# Patient Record
Sex: Male | Born: 1950 | Race: Asian | Hispanic: No | Marital: Married | Smoking: Never smoker
Health system: Southern US, Community
[De-identification: ages and names within clinical notes are randomized; demographics above are authoritative.]

## PROBLEM LIST (undated history)

## (undated) DIAGNOSIS — M1712 Unilateral primary osteoarthritis, left knee: Secondary | ICD-10-CM

## (undated) DIAGNOSIS — E119 Type 2 diabetes mellitus without complications: Secondary | ICD-10-CM

## (undated) DIAGNOSIS — I10 Essential (primary) hypertension: Secondary | ICD-10-CM

## (undated) HISTORY — PX: CATARACT EXTRACTION W/ INTRAOCULAR LENS  IMPLANT, BILATERAL: SHX1307

## (undated) HISTORY — PX: LEG SURGERY: SHX1003

---

## 2004-03-06 ENCOUNTER — Ambulatory Visit: Payer: Self-pay | Admitting: *Deleted

## 2004-09-07 ENCOUNTER — Ambulatory Visit: Payer: Self-pay | Admitting: Internal Medicine

## 2004-11-02 ENCOUNTER — Ambulatory Visit: Payer: Self-pay | Admitting: Family Medicine

## 2005-03-01 ENCOUNTER — Ambulatory Visit: Payer: Self-pay | Admitting: Family Medicine

## 2005-08-09 ENCOUNTER — Ambulatory Visit: Payer: Self-pay | Admitting: Family Medicine

## 2005-11-12 ENCOUNTER — Ambulatory Visit: Payer: Self-pay | Admitting: Family Medicine

## 2005-11-13 ENCOUNTER — Ambulatory Visit: Payer: Self-pay | Admitting: Family Medicine

## 2005-11-14 ENCOUNTER — Ambulatory Visit (HOSPITAL_COMMUNITY): Admission: RE | Admit: 2005-11-14 | Discharge: 2005-11-14 | Payer: Self-pay | Admitting: Family Medicine

## 2005-12-20 ENCOUNTER — Ambulatory Visit: Payer: Self-pay | Admitting: Family Medicine

## 2006-03-10 ENCOUNTER — Ambulatory Visit: Payer: Self-pay | Admitting: Family Medicine

## 2006-07-24 ENCOUNTER — Ambulatory Visit: Payer: Self-pay | Admitting: Internal Medicine

## 2007-03-04 ENCOUNTER — Encounter (INDEPENDENT_AMBULATORY_CARE_PROVIDER_SITE_OTHER): Payer: Self-pay | Admitting: *Deleted

## 2007-04-24 ENCOUNTER — Ambulatory Visit: Payer: Self-pay | Admitting: Family Medicine

## 2007-04-30 ENCOUNTER — Ambulatory Visit (HOSPITAL_COMMUNITY): Admission: RE | Admit: 2007-04-30 | Discharge: 2007-04-30 | Payer: Self-pay | Admitting: Family Medicine

## 2007-05-06 ENCOUNTER — Ambulatory Visit: Payer: Self-pay | Admitting: Family Medicine

## 2007-05-27 ENCOUNTER — Ambulatory Visit: Payer: Self-pay | Admitting: Family Medicine

## 2007-05-27 LAB — CONVERTED CEMR LAB
BUN: 22 mg/dL (ref 6–23)
Chloride: 107 meq/L (ref 96–112)
Creatinine, Ser: 0.9 mg/dL (ref 0.40–1.50)
HDL: 48 mg/dL (ref >39)
Potassium: 4.2 meq/L (ref 3.5–5.3)
Sodium: 141 meq/L (ref 135–145)
Total Bilirubin: 0.7 mg/dL (ref 0.3–1.2)
Total CHOL/HDL Ratio: 3.1
VLDL: 17 mg/dL (ref 0–40)

## 2007-08-12 ENCOUNTER — Ambulatory Visit: Payer: Self-pay | Admitting: Family Medicine

## 2008-02-19 ENCOUNTER — Ambulatory Visit: Payer: Self-pay | Admitting: Family Medicine

## 2008-03-25 ENCOUNTER — Ambulatory Visit: Payer: Self-pay | Admitting: Family Medicine

## 2008-03-25 LAB — CONVERTED CEMR LAB
CO2: 21 meq/L (ref 19–32)
Calcium: 9 mg/dL (ref 8.4–10.5)
Cholesterol: 156 mg/dL (ref 0–200)
Creatinine, Ser: 0.99 mg/dL (ref 0.40–1.50)
Glucose, Bld: 101 mg/dL — ABNORMAL HIGH (ref 70–99)
PSA: 0.83 ng/mL (ref 0.10–4.00)
Potassium: 4.1 meq/L (ref 3.5–5.3)
Sodium: 138 meq/L (ref 135–145)

## 2008-03-30 IMAGING — CR DG KNEE 4+V BILAT
8 series · 8 of 8 positions shown · non-contrast
Comparison: none

CLINICAL DATA: Knee pain.
 LEFT KNEE ? 4 VIEW:

[view not recorded (1 of 8)]
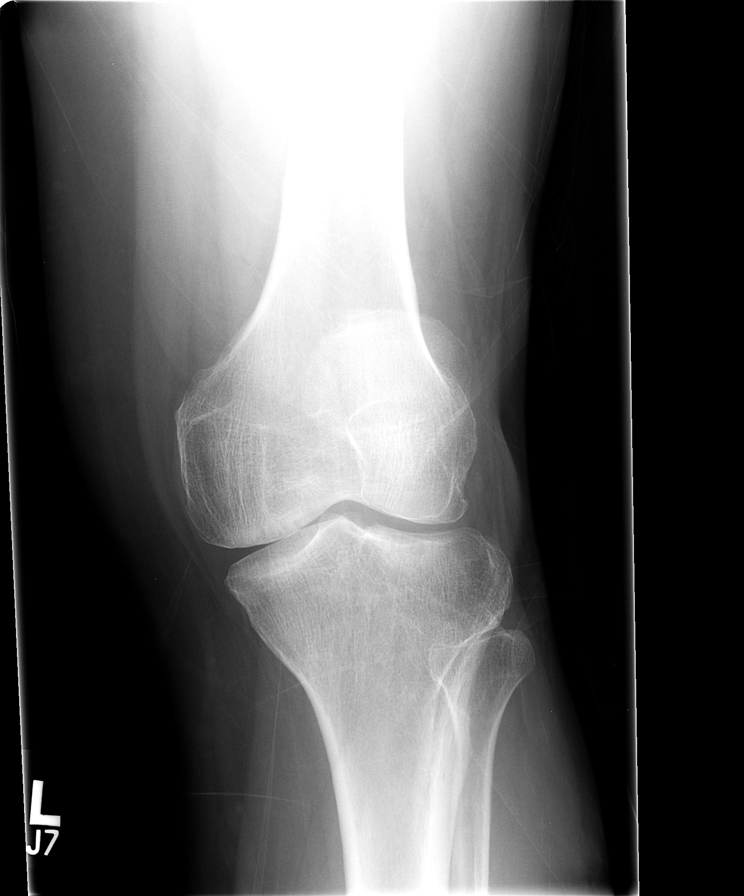

[view not recorded (2 of 8)]
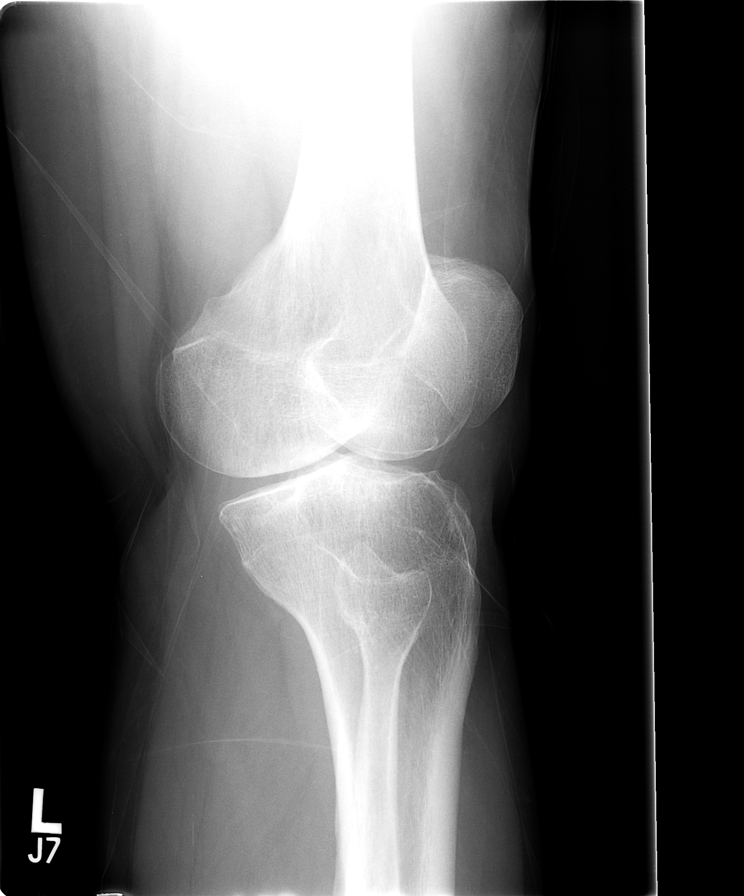

[view not recorded (3 of 8)]
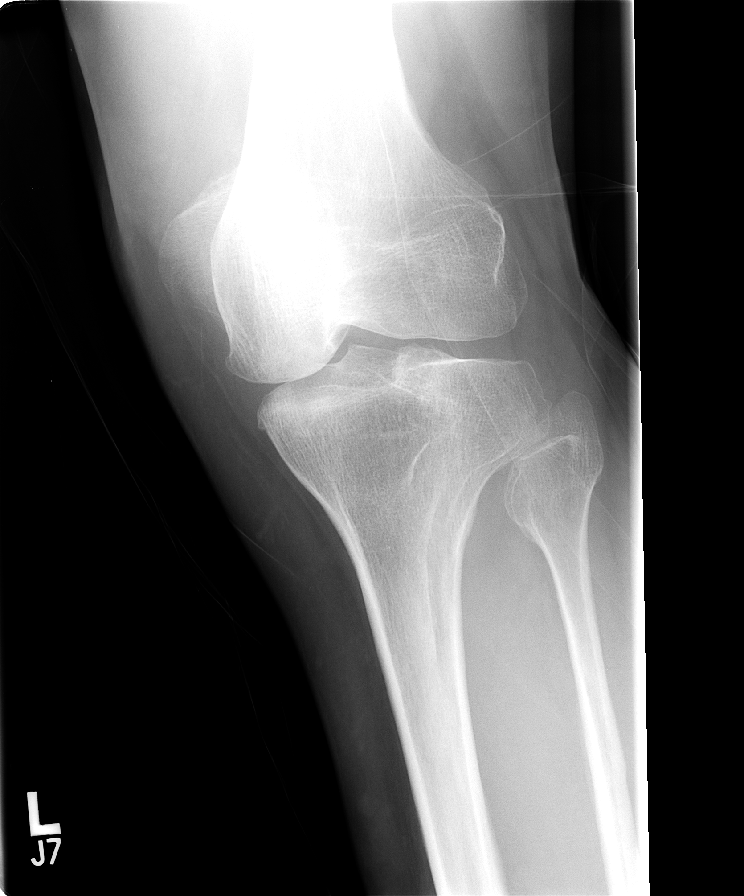

[view not recorded (4 of 8)]
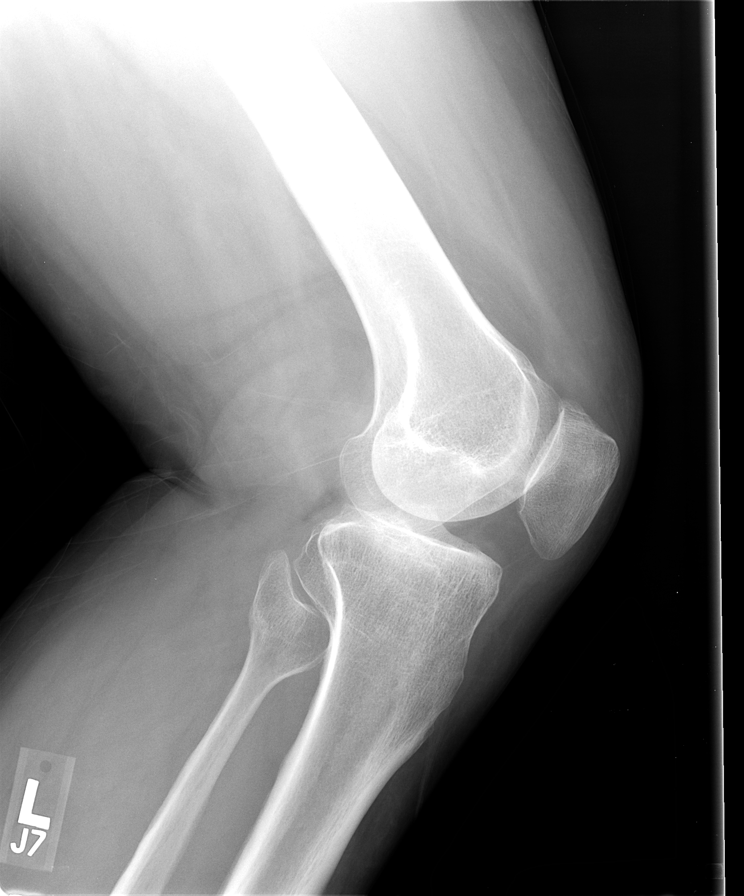

[view not recorded (5 of 8)]
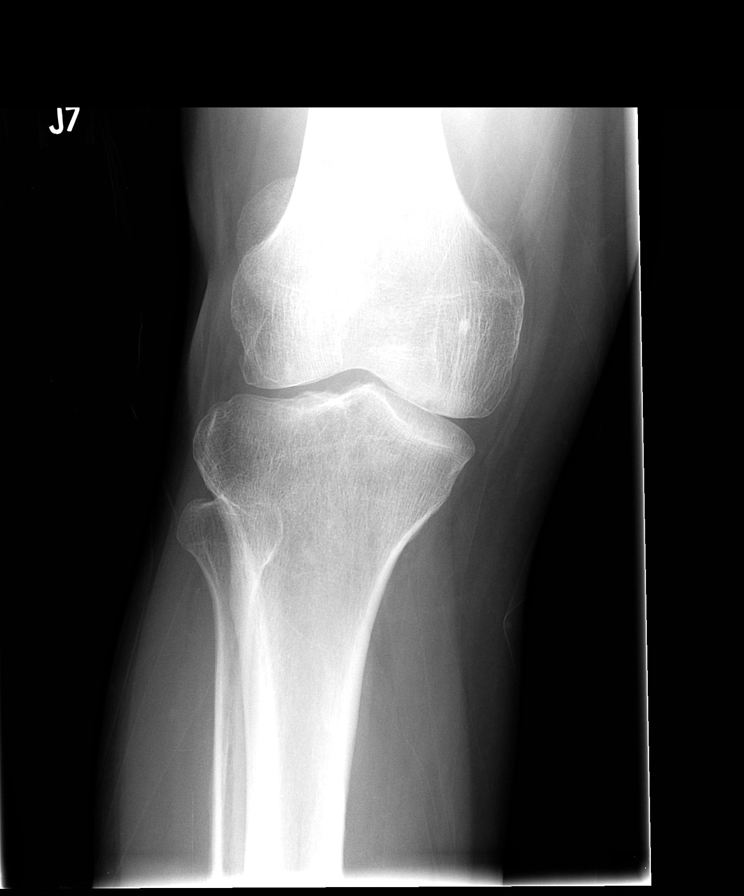

[view not recorded (6 of 8)]
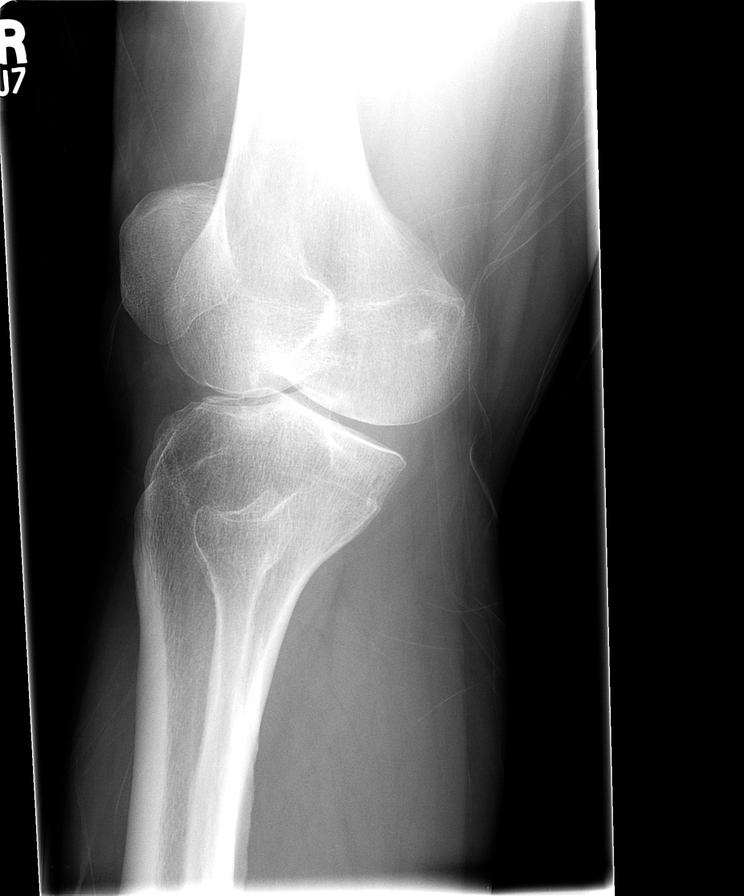

[view not recorded (7 of 8)]
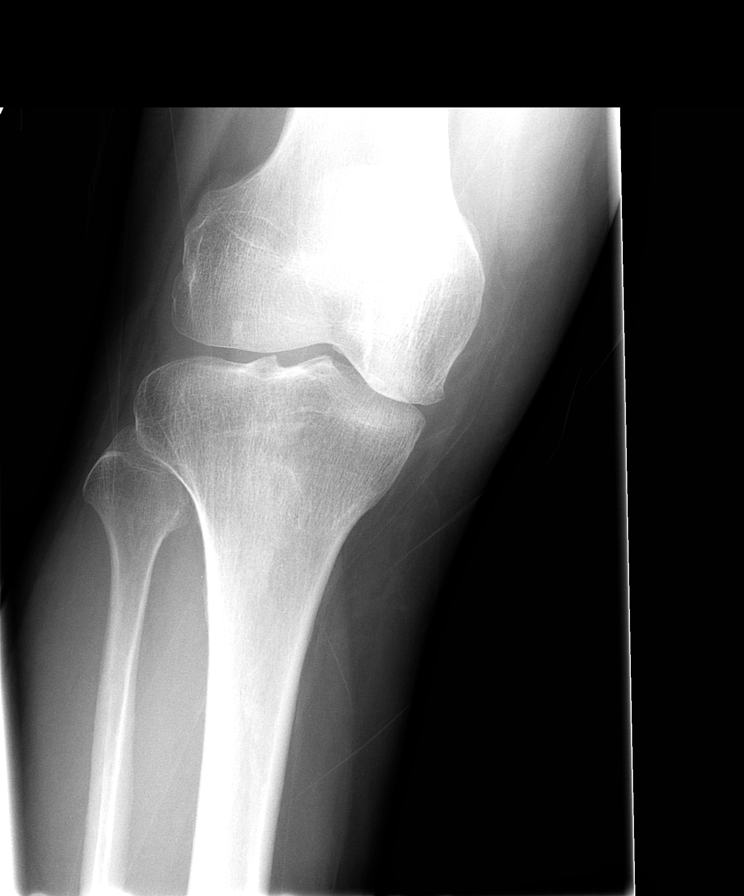

[view not recorded (8 of 8)]
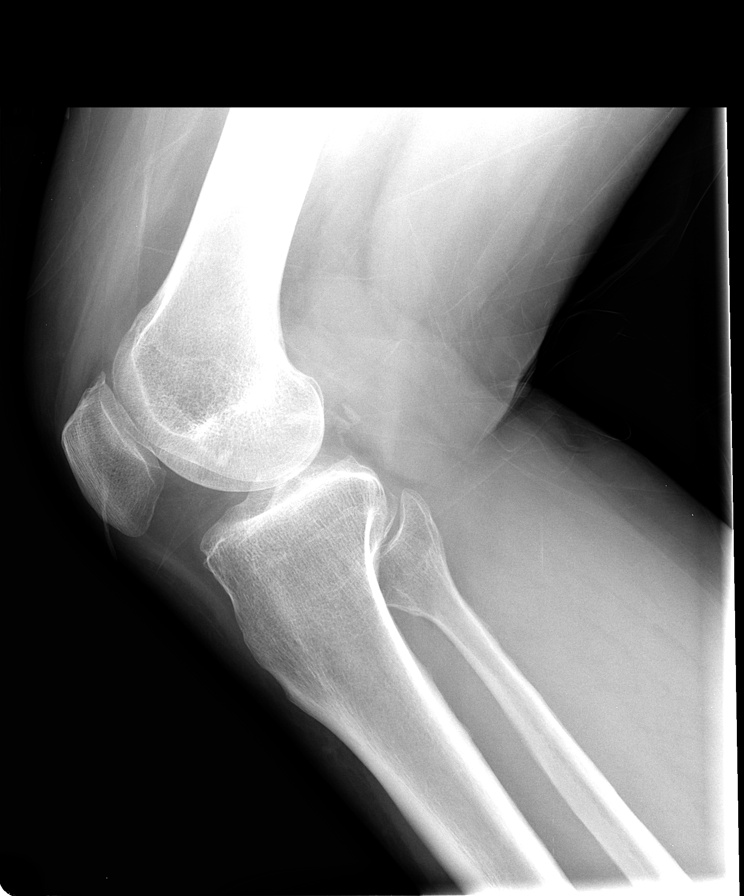

[8 of 8 positions shown; findings below may reference images not displayed]

FINDINGS: There is no evidence of fracture or dislocation.  There is no evidence of knee joint effusion.  Mild degenerative spurring is seen involving the medial compartment without significant joint space narrowing.  No other bone abnormality is identified.
IMPRESSION: 1.  No acute findings.
 2.  Mild medial compartment degenerative spurring.
 RIGHT KNEE ? 4 VIEW:
FINDINGS: There is no evidence of fracture or dislocation.  There is no definite evidence of knee joint effusion.  Mild degenerative spurring is seen involving both the medial and patellofemoral compartments without significant joint space narrowing.
IMPRESSION: 1.  No acute findings.
 2.  Mild medial and patellofemoral compartment degenerative spurring.

## 2009-02-12 ENCOUNTER — Emergency Department (HOSPITAL_COMMUNITY): Admission: EM | Admit: 2009-02-12 | Discharge: 2009-02-13 | Payer: Self-pay | Admitting: Emergency Medicine

## 2018-01-22 ENCOUNTER — Other Ambulatory Visit: Payer: Self-pay | Admitting: Orthopedic Surgery

## 2018-01-22 NOTE — Pre-Procedure Instructions (Signed)
Derek Lara  01/22/2018      [facility] - [place], [place] - [address] [place] [address] Phone: [phone] Fax: [phone]    Your procedure is scheduled on February 03, 2018.  Report to [facility] Admitting at 530 AM.  Call this number if you have problems the morning of surgery:  [phone]   Remember:  Do not eat or drink after midnight.    Take these medicines the morning of surgery with A SIP OF WATER  Gabapentin (neurontin) Metoprolol tartrate(lopressor)  7 days prior to surgery STOP taking any meloxicam (mobic), Aspirin (unless otherwise instructed by your surgeon), Aleve, Naproxen, Ibuprofen, Motrin, Advil, Goody's, BC's, all herbal medications, fish oil, and all vitamins  WHAT DO I DO ABOUT MY DIABETES MEDICATION?  Marland Kitchen Do not take oral diabetes medicines (pills) the morning of surgery metformin (glucophage). .  Reviewed and Endorsed by [facility] Patient Education Committee, August 2015  How to Manage Your Diabetes Before and After Surgery  Why is it important to control my blood sugar before and after surgery? . Improving blood sugar levels before and after surgery helps healing and can limit problems. . A way of improving blood sugar control is eating a healthy diet by: o  Eating less sugar and carbohydrates o  Increasing activity/exercise o  Talking with your doctor about reaching your blood sugar goals . High blood sugars (greater than 180 mg/dL) can raise your risk of infections and slow your recovery, so you will need to focus on controlling your diabetes during the weeks before surgery. . Make sure that the doctor who takes care of your diabetes knows about your planned surgery including the date and location.  How do I manage my blood sugar before surgery? . Check your blood sugar at least 4 times a day, starting 2 days before surgery, to make sure that the level is not too high or  low. o Check your blood sugar the morning of your surgery when you wake up and every 2 hours until you get to the Short Stay unit. . If your blood sugar is less than 70 mg/dL, you will need to treat for low blood sugar: o Do not take insulin. o Treat a low blood sugar (less than 70 mg/dL) with  cup of clear juice (cranberry or apple), 4 glucose tablets, OR glucose gel. Recheck blood sugar in 15 minutes after treatment (to make sure it is greater than 70 mg/dL). If your blood sugar is not greater than 70 mg/dL on recheck, call [phone] o  for further instructions. . Report your blood sugar to the short stay nurse when you get to Short Stay.  . If you are admitted to the hospital after surgery: o Your blood sugar will be checked by the staff and you will probably be given insulin after surgery (instead of oral diabetes medicines) to make sure you have good blood sugar levels. o The goal for blood sugar control after surgery is 80-180 mg/dL.   Do not wear jewelry, make-up or nail polish.  Do not wear lotions, powders, or perfumes, or deodorant.  Do not shave 48 hours prior to surgery.  Men may shave face and neck.  Do not bring valuables to the hospital.  [facility] is not responsible for any belongings or valuables.  Contacts, dentures or bridgework may not be worn into surgery.  Leave your suitcase in the car.  After surgery it  may be brought to your room.  For patients admitted to the hospital, discharge time will be determined by your treatment team.  Patients discharged the day of surgery will not be allowed to drive home.   [facility]- Preparing For Surgery  Before surgery, you can play an important role. Because skin is not sterile, your skin needs to be as free of germs as possible. You can reduce the number of germs on your skin by washing with CHG (chlorahexidine gluconate) Soap before surgery.  CHG is an antiseptic cleaner which kills germs and bonds with the skin to  continue killing germs even after washing.    Oral Hygiene is also important to reduce your risk of infection.  Remember - BRUSH YOUR TEETH THE MORNING OF SURGERY WITH YOUR REGULAR TOOTHPASTE  Please do not use if you have an allergy to CHG or antibacterial soaps. If your skin becomes reddened/irritated stop using the CHG.  Do not shave (including legs and underarms) for at least 48 hours prior to first CHG shower. It is OK to shave your face.  Please follow these instructions carefully.   1. Shower the NIGHT BEFORE SURGERY and the MORNING OF SURGERY with CHG.   2. If you chose to wash your hair, wash your hair first as usual with your normal shampoo.  3. After you shampoo, rinse your hair and body thoroughly to remove the shampoo.  4. Use CHG as you would any other liquid soap. You can apply CHG directly to the skin and wash gently with a scrungie or a clean washcloth.   5. Apply the CHG Soap to your body ONLY FROM THE NECK DOWN.  Do not use on open wounds or open sores. Avoid contact with your eyes, ears, mouth and genitals (private parts). Wash Face and genitals (private parts)  with your normal soap.  6. Wash thoroughly, paying special attention to the area where your surgery will be performed.  7. Thoroughly rinse your body with warm water from the neck down.  8. DO NOT shower/wash with your normal soap after using and rinsing off the CHG Soap.  9. Pat yourself dry with a CLEAN TOWEL.  10. Wear CLEAN PAJAMAS to bed the night before surgery, wear comfortable clothes the morning of surgery  11. Place CLEAN SHEETS on your bed the night of your first shower and DO NOT SLEEP WITH PETS.  Day of Surgery:  Do not apply any deodorants/lotions.  Please wear clean clothes to the hospital/surgery center.   Remember to brush your teeth WITH YOUR REGULAR TOOTHPASTE.  Please read over the following fact sheets that you were given. Pain Booklet, Coughing and Deep Breathing, MRSA  Information and Surgical Site Infection Prevention

## 2018-01-23 ENCOUNTER — Other Ambulatory Visit: Payer: Self-pay

## 2018-01-23 ENCOUNTER — Encounter (HOSPITAL_COMMUNITY): Payer: Self-pay

## 2018-01-23 ENCOUNTER — Encounter (HOSPITAL_COMMUNITY)
Admission: RE | Admit: 2018-01-23 | Discharge: 2018-01-23 | Disposition: A | Discharge status: Home / Self Care | Payer: Medicare Other | Source: Ambulatory Visit | Attending: Orthopedic Surgery | Admitting: Orthopedic Surgery

## 2018-01-23 DIAGNOSIS — Z79899 Other long term (current) drug therapy: Secondary | ICD-10-CM | POA: Diagnosis not present

## 2018-01-23 DIAGNOSIS — Z7984 Long term (current) use of oral hypoglycemic drugs: Secondary | ICD-10-CM | POA: Diagnosis not present

## 2018-01-23 DIAGNOSIS — I10 Essential (primary) hypertension: Secondary | ICD-10-CM | POA: Insufficient documentation

## 2018-01-23 DIAGNOSIS — M1712 Unilateral primary osteoarthritis, left knee: Secondary | ICD-10-CM | POA: Diagnosis not present

## 2018-01-23 DIAGNOSIS — E119 Type 2 diabetes mellitus without complications: Secondary | ICD-10-CM | POA: Insufficient documentation

## 2018-01-23 DIAGNOSIS — Z01812 Encounter for preprocedural laboratory examination: Secondary | ICD-10-CM | POA: Diagnosis present

## 2018-01-23 DIAGNOSIS — Z01810 Encounter for preprocedural cardiovascular examination: Secondary | ICD-10-CM | POA: Insufficient documentation

## 2018-01-23 HISTORY — DX: Essential (primary) hypertension: I10

## 2018-01-23 HISTORY — DX: Type 2 diabetes mellitus without complications: E11.9

## 2018-01-23 LAB — BASIC METABOLIC PANEL
Anion gap: 7 (ref 5–15)
BUN: 23 mg/dL (ref 8–23)
CALCIUM: 9.5 mg/dL (ref 8.9–10.3)
CO2: 26 mmol/L (ref 22–32)
CREATININE: 1.73 mg/dL — AB (ref 0.61–1.24)
Chloride: 109 mmol/L (ref 98–111)
GFR calc Af Amer: 46 mL/min — ABNORMAL LOW (ref >60)
GFR, EST NON AFRICAN AMERICAN: 39 mL/min — AB (ref >60)
Glucose, Bld: 117 mg/dL — ABNORMAL HIGH (ref 70–99)
Potassium: 5.1 mmol/L (ref 3.5–5.1)
SODIUM: 142 mmol/L (ref 135–145)

## 2018-01-23 LAB — CBC
HCT: 41.5 % (ref 39.0–52.0)
Hemoglobin: 12.8 g/dL — ABNORMAL LOW (ref 13.0–17.0)
MCH: 30.5 pg (ref 26.0–34.0)
MCHC: 30.8 g/dL (ref 30.0–36.0)
MCV: 99 fL (ref 78.0–100.0)
PLATELETS: 287 10*3/uL (ref 150–400)
RBC: 4.19 MIL/uL — ABNORMAL LOW (ref 4.22–5.81)
RDW: 13.6 % (ref 11.5–15.5)
WBC: 9 10*3/uL (ref 4.0–10.5)

## 2018-01-23 LAB — GLUCOSE, CAPILLARY: Glucose-Capillary: 100 mg/dL — ABNORMAL HIGH (ref 70–99)

## 2018-01-23 LAB — SURGICAL PCR SCREEN
MRSA, PCR: NEGATIVE
Staphylococcus aureus: NEGATIVE

## 2018-01-23 LAB — HEMOGLOBIN A1C
Hgb A1c MFr Bld: 6.2 % — ABNORMAL HIGH (ref 4.8–5.6)
Mean Plasma Glucose: 131.24 mg/dL

## 2018-01-23 NOTE — Progress Notes (Signed)
Requesting stress test , last ov from Dr. Algie CofferKadakia.  Faxed stop bang form to patient's PCP - [place].

## 2018-01-23 NOTE — Progress Notes (Signed)
   01/23/18 1116  OBSTRUCTIVE SLEEP APNEA  Have you ever been diagnosed with sleep apnea through a sleep study? No  Do you snore loudly (loud enough to be heard through closed doors)?  0  Do you often feel tired, fatigued, or sleepy during the daytime (such as falling asleep during driving or talking to someone)? 0  Has anyone observed you stop breathing during your sleep? 0  Do you have, or are you being treated for high blood pressure? 1  BMI more than 35 kg/m2? 1  Age > 50 (1-yes) 1  Neck circumference greater than:Male 16 inches or larger, Male 17inches or larger? 1  Male Gender (Yes=1) 1  Obstructive Sleep Apnea Score 5  Score 5 or greater  Results sent to PCP

## 2018-01-30 NOTE — Progress Notes (Signed)
Anesthesia Chart Review:  Case:  [id number] Date/Time:  02/03/18 0715   Procedure:  LEFT UNICOMPARTMENTAL KNEE (Left Knee)   Anesthesia type:  Choice   Pre-op diagnosis:  OA LEFT KNEE   Location:  [facility] OR ROOM 04 / [facility] OR   Surgeon:  Teryl LucyLandau, Joshua, MD      DISCUSSION: 67 yo male never smoker for above procedure. Pertinent hx includes DMII and HTN.  On PAT labs pt noted to have elevated creatinine at 1.73. No previous labs for comparison. I called his PCP's office to clarify whether he has hx of CKD and if he is being monitored for this. Dr. Ardelle ParkHaque brought pt in for repeat BMP on 01/28/2018 which showed creatinine 1.57. Per Dr. Ardelle ParkHaque this is pt's baseline and he is following the pt for this, stated ok to proceed with surgery.  Anticipate he can proceed with surgery as planned barring acute status change.  VS: BP (!) 156/74   Pulse (!) 56   Temp 36.7 C   Resp 20   Ht 5\' 6"  (1.676 m)   Wt 110.9 kg   SpO2 96%   BMI 39.45 kg/m   PROVIDERS: Verlene MayerHaque, [place], MD is PCP   LABS: Labs reviewed: Acceptable for surgery. Elevated Creatinine near pt baseline per Dr. Ardelle ParkHaque. (all labs ordered are listed, but only abnormal results are displayed)  Labs Reviewed  GLUCOSE, CAPILLARY - Abnormal; Notable for the following components:      Result Value   Glucose-Capillary 100 (*)    All other components within normal limits  CBC - Abnormal; Notable for the following components:   RBC 4.19 (*)    Hemoglobin 12.8 (*)    All other components within normal limits  BASIC METABOLIC PANEL - Abnormal; Notable for the following components:   Glucose, Bld 117 (*)    Creatinine, Ser 1.73 (*)    GFR calc non Af Amer 39 (*)    GFR calc Af Amer 46 (*)    All other components within normal limits  HEMOGLOBIN A1C - Abnormal; Notable for the following components:   Hgb A1c MFr Bld 6.2 (*)    All other components within normal limits  SURGICAL PCR SCREEN     IMAGES: N/A  EKG: 01/23/2018: Normal sinus rhythm.  Right bundle branch block  CV: N/A  Past Medical History:  Diagnosis Date  . Diabetes mellitus without complication (HCC)   . Hypertension     Past Surgical History:  Procedure Laterality Date  . CATARACT EXTRACTION W/ INTRAOCULAR LENS  IMPLANT, BILATERAL    . LEG SURGERY     vascular    MEDICATIONS: . atorvastatin (LIPITOR) 20 MG tablet  . gabapentin (NEURONTIN) 300 MG capsule  . lisinopril-hydrochlorothiazide (PRINZIDE,ZESTORETIC) 20-25 MG tablet  . meloxicam (MOBIC) 15 MG tablet  . metFORMIN (GLUCOPHAGE) 500 MG tablet  . metoprolol tartrate (LOPRESSOR) 50 MG tablet   No current facility-administered medications for this encounter.      Zannie CoveJames Burns, PA-C [facility] Phone [phone] 01/30/2018 9:59 AM

## 2018-02-02 NOTE — Anesthesia Preprocedure Evaluation (Addendum)
Anesthesia Evaluation  Patient identified by MRN, date of birth, ID band Patient awake    Reviewed: Allergy & Precautions, H&P , NPO status , Patient's Chart, lab work & pertinent test results, reviewed documented beta blocker date and time   Airway Mallampati: I       Dental no notable dental hx. (+) Teeth Intact   Pulmonary neg pulmonary ROS,    Pulmonary exam normal breath sounds clear to auscultation       Cardiovascular Exercise Tolerance: Good hypertension, Pt. on medications and Pt. on home beta blockers negative cardio ROS Normal cardiovascular exam Rhythm:Regular Rate:Normal     Neuro/Psych negative neurological ROS  negative psych ROS   GI/Hepatic negative GI ROS, Neg liver ROS,   Endo/Other  negative endocrine ROSdiabetes, Type 2, Oral Hypoglycemic AgentsMorbid obesity  Renal/GU negative Renal ROS  negative genitourinary   Musculoskeletal   Abdominal (+) + obese,   Peds  Hematology negative hematology ROS (+)   Anesthesia Other Findings   Reproductive/Obstetrics negative OB ROS                            Anesthesia Physical Anesthesia Plan  ASA: III  Anesthesia Plan: Spinal   Post-op Pain Management:  Regional for Post-op pain   Induction:   PONV Risk Score and Plan: 1 and Ondansetron, Propofol infusion and Midazolam  Airway Management Planned: Simple Face Mask, Natural Airway and Nasal Cannula  Additional Equipment:   Intra-op Plan:   Post-operative Plan:   Informed Consent: I have reviewed the patients History and Physical, chart, labs and discussed the procedure including the risks, benefits and alternatives for the proposed anesthesia with the patient or authorized representative who has indicated his/her understanding and acceptance.     Plan Discussed with: CRNA and Surgeon  Anesthesia Plan Comments:        Anesthesia Quick Evaluation

## 2018-02-03 ENCOUNTER — Ambulatory Visit (HOSPITAL_COMMUNITY)
Admission: RE | Admit: 2018-02-03 | Discharge: 2018-02-03 | Disposition: A | Discharge status: Home / Self Care | Payer: Medicare Other | Source: Ambulatory Visit | Attending: Orthopedic Surgery | Admitting: Orthopedic Surgery

## 2018-02-10 ENCOUNTER — Ambulatory Visit (HOSPITAL_COMMUNITY): Payer: Medicare Other | Admitting: Physician Assistant

## 2018-02-10 ENCOUNTER — Other Ambulatory Visit: Payer: Self-pay

## 2018-02-10 ENCOUNTER — Encounter (HOSPITAL_COMMUNITY): Payer: Self-pay | Admitting: Anesthesiology

## 2018-02-10 ENCOUNTER — Observation Stay (HOSPITAL_COMMUNITY): Payer: Medicare Other

## 2018-02-10 ENCOUNTER — Encounter (HOSPITAL_COMMUNITY)
Admission: RE | Disposition: A | Discharge status: Home / Self Care | Payer: Self-pay | Source: Ambulatory Visit | Attending: Orthopedic Surgery

## 2018-02-10 ENCOUNTER — Observation Stay (HOSPITAL_COMMUNITY)
Admission: RE | Admit: 2018-02-10 | Discharge: 2018-02-11 | Disposition: A | Discharge status: Home / Self Care | Payer: Medicare Other | Source: Ambulatory Visit | Attending: Orthopedic Surgery | Admitting: Orthopedic Surgery

## 2018-02-10 DIAGNOSIS — Z79899 Other long term (current) drug therapy: Secondary | ICD-10-CM | POA: Insufficient documentation

## 2018-02-10 DIAGNOSIS — Z7984 Long term (current) use of oral hypoglycemic drugs: Secondary | ICD-10-CM | POA: Diagnosis not present

## 2018-02-10 DIAGNOSIS — I10 Essential (primary) hypertension: Secondary | ICD-10-CM | POA: Diagnosis not present

## 2018-02-10 DIAGNOSIS — Z6839 Body mass index (BMI) 39.0-39.9, adult: Secondary | ICD-10-CM | POA: Diagnosis not present

## 2018-02-10 DIAGNOSIS — E119 Type 2 diabetes mellitus without complications: Secondary | ICD-10-CM | POA: Diagnosis not present

## 2018-02-10 DIAGNOSIS — M1712 Unilateral primary osteoarthritis, left knee: Secondary | ICD-10-CM | POA: Diagnosis present

## 2018-02-10 DIAGNOSIS — Z7982 Long term (current) use of aspirin: Secondary | ICD-10-CM | POA: Insufficient documentation

## 2018-02-10 DIAGNOSIS — Z96652 Presence of left artificial knee joint: Secondary | ICD-10-CM

## 2018-02-10 HISTORY — PX: PARTIAL KNEE ARTHROPLASTY: SHX2174

## 2018-02-10 HISTORY — DX: Unilateral primary osteoarthritis, left knee: M17.12

## 2018-02-10 LAB — GLUCOSE, CAPILLARY
GLUCOSE-CAPILLARY: 122 mg/dL — AB (ref 70–99)
GLUCOSE-CAPILLARY: 141 mg/dL — AB (ref 70–99)
Glucose-Capillary: 122 mg/dL — ABNORMAL HIGH (ref 70–99)
Glucose-Capillary: 125 mg/dL — ABNORMAL HIGH (ref 70–99)

## 2018-02-10 SURGERY — ARTHROPLASTY, KNEE, UNICOMPARTMENTAL
Anesthesia: Spinal | Site: Knee | Laterality: Left

## 2018-02-10 MED ORDER — CHLORHEXIDINE GLUCONATE 4 % EX LIQD: 60.0000 mL | Freq: Once | CUTANEOUS | Status: DC

## 2018-02-10 MED ORDER — METHOCARBAMOL 500 MG PO TABS
500.0000 mg | ORAL_TABLET | Freq: Four times a day (QID) | ORAL | Status: DC | PRN
  Administered 2018-02-10 – 2018-02-11 (×2): 500 mg via ORAL
  Filled 2018-02-10: qty 1

## 2018-02-10 MED ORDER — HYDROMORPHONE HCL 1 MG/ML IJ SOLN: 0.2500 mg | INTRAMUSCULAR | Status: DC | PRN

## 2018-02-10 MED ORDER — CEFAZOLIN SODIUM-DEXTROSE 2-4 GM/100ML-% IV SOLN
2.0000 g | INTRAVENOUS | Status: AC
  Administered 2018-02-10: 2 g via INTRAVENOUS

## 2018-02-10 MED ORDER — ONDANSETRON HCL 4 MG/2ML IJ SOLN
INTRAMUSCULAR | Status: AC
  Filled 2018-02-10: qty 2

## 2018-02-10 MED ORDER — METOPROLOL TARTRATE 50 MG PO TABS
50.0000 mg | ORAL_TABLET | Freq: Two times a day (BID) | ORAL | Status: DC
  Administered 2018-02-10 – 2018-02-11 (×2): 50 mg via ORAL
  Filled 2018-02-10 (×2): qty 1

## 2018-02-10 MED ORDER — POLYETHYLENE GLYCOL 3350 17 G PO PACK: 17.0000 g | PACK | Freq: Every day | ORAL | Status: DC | PRN

## 2018-02-10 MED ORDER — METFORMIN HCL 500 MG PO TABS
500.0000 mg | ORAL_TABLET | Freq: Two times a day (BID) | ORAL | Status: DC
  Administered 2018-02-10 – 2018-02-11 (×2): 500 mg via ORAL
  Filled 2018-02-10 (×2): qty 1

## 2018-02-10 MED ORDER — PROPOFOL 500 MG/50ML IV EMUL
INTRAVENOUS | Status: DC | PRN
  Administered 2018-02-10: 50 ug/kg/min via INTRAVENOUS

## 2018-02-10 MED ORDER — HYDROCODONE-ACETAMINOPHEN 5-325 MG PO TABS
ORAL_TABLET | ORAL | Status: AC
  Administered 2018-02-10: 14:00:00
  Filled 2018-02-10: qty 1

## 2018-02-10 MED ORDER — METOCLOPRAMIDE HCL 5 MG/ML IJ SOLN: 5.0000 mg | Freq: Three times a day (TID) | INTRAMUSCULAR | Status: DC | PRN

## 2018-02-10 MED ORDER — ACETAMINOPHEN 325 MG PO TABS: 325.0000 mg | ORAL_TABLET | Freq: Four times a day (QID) | ORAL | Status: DC | PRN

## 2018-02-10 MED ORDER — ALUM & MAG HYDROXIDE-SIMETH 200-200-20 MG/5ML PO SUSP: 30.0000 mL | ORAL | Status: DC | PRN

## 2018-02-10 MED ORDER — ONDANSETRON HCL 4 MG PO TABS: 4.0000 mg | ORAL_TABLET | Freq: Three times a day (TID) | ORAL | 0 refills | Status: DC | PRN

## 2018-02-10 MED ORDER — BUPIVACAINE HCL (PF) 0.25 % IJ SOLN
INTRAMUSCULAR | Status: AC
  Filled 2018-02-10: qty 30

## 2018-02-10 MED ORDER — ONDANSETRON HCL 4 MG/2ML IJ SOLN
INTRAMUSCULAR | Status: DC | PRN
  Administered 2018-02-10: 4 mg via INTRAVENOUS

## 2018-02-10 MED ORDER — METHOCARBAMOL 500 MG PO TABS
ORAL_TABLET | ORAL | Status: AC
  Administered 2018-02-10: 14:00:00
  Filled 2018-02-10: qty 1

## 2018-02-10 MED ORDER — ROPIVACAINE HCL 7.5 MG/ML IJ SOLN
INTRAMUSCULAR | Status: DC | PRN
  Administered 2018-02-10 (×6): 5 mL via PERINEURAL

## 2018-02-10 MED ORDER — BACLOFEN 10 MG PO TABS: 10.0000 mg | ORAL_TABLET | Freq: Three times a day (TID) | ORAL | 0 refills | Status: DC

## 2018-02-10 MED ORDER — SENNA-DOCUSATE SODIUM 8.6-50 MG PO TABS: 2.0000 | ORAL_TABLET | Freq: Every day | ORAL | 1 refills | Status: DC

## 2018-02-10 MED ORDER — DEXAMETHASONE SODIUM PHOSPHATE 10 MG/ML IJ SOLN
10.0000 mg | Freq: Once | INTRAMUSCULAR | Status: AC
  Administered 2018-02-11: 10 mg via INTRAVENOUS
  Filled 2018-02-10: qty 1

## 2018-02-10 MED ORDER — PROMETHAZINE HCL 25 MG/ML IJ SOLN: 6.2500 mg | INTRAMUSCULAR | Status: DC | PRN

## 2018-02-10 MED ORDER — FENTANYL CITRATE (PF) 100 MCG/2ML IJ SOLN
INTRAMUSCULAR | Status: AC
  Administered 2018-02-10: 100 ug via INTRAVENOUS
  Filled 2018-02-10: qty 2

## 2018-02-10 MED ORDER — ASPIRIN EC 325 MG PO TBEC
325.0000 mg | DELAYED_RELEASE_TABLET | Freq: Two times a day (BID) | ORAL | Status: DC
  Administered 2018-02-10 – 2018-02-11 (×2): 325 mg via ORAL
  Filled 2018-02-10 (×2): qty 1

## 2018-02-10 MED ORDER — PROPOFOL 10 MG/ML IV BOLUS
INTRAVENOUS | Status: DC | PRN
  Administered 2018-02-10 (×2): 20 mg via INTRAVENOUS

## 2018-02-10 MED ORDER — ZOLPIDEM TARTRATE 5 MG PO TABS: 5.0000 mg | ORAL_TABLET | Freq: Every evening | ORAL | Status: DC | PRN

## 2018-02-10 MED ORDER — BUPIVACAINE IN DEXTROSE 0.75-8.25 % IT SOLN
INTRATHECAL | Status: DC | PRN
  Administered 2018-02-10: 1.8 mL via INTRATHECAL

## 2018-02-10 MED ORDER — 0.9 % SODIUM CHLORIDE (POUR BTL) OPTIME
TOPICAL | Status: DC | PRN
  Administered 2018-02-10: 1000 mL

## 2018-02-10 MED ORDER — METHOCARBAMOL 1000 MG/10ML IJ SOLN
500.0000 mg | Freq: Four times a day (QID) | INTRAVENOUS | Status: DC | PRN
  Filled 2018-02-10: qty 5

## 2018-02-10 MED ORDER — SODIUM CHLORIDE 0.9 % IV SOLN
INTRAVENOUS | Status: DC | PRN
  Administered 2018-02-10: 25 ug/min via INTRAVENOUS

## 2018-02-10 MED ORDER — KETOROLAC TROMETHAMINE 30 MG/ML IJ SOLN: 30.0000 mg | Freq: Once | INTRAMUSCULAR | Status: DC | PRN

## 2018-02-10 MED ORDER — LISINOPRIL-HYDROCHLOROTHIAZIDE 20-25 MG PO TABS: 1.0000 | ORAL_TABLET | Freq: Every day | ORAL | Status: DC

## 2018-02-10 MED ORDER — DIPHENHYDRAMINE HCL 12.5 MG/5ML PO ELIX: 12.5000 mg | ORAL_SOLUTION | ORAL | Status: DC | PRN

## 2018-02-10 MED ORDER — EPHEDRINE SULFATE-NACL 50-0.9 MG/10ML-% IV SOSY
PREFILLED_SYRINGE | INTRAVENOUS | Status: DC | PRN
  Administered 2018-02-10: 10 mg via INTRAVENOUS

## 2018-02-10 MED ORDER — EPHEDRINE 5 MG/ML INJ
INTRAVENOUS | Status: AC
  Filled 2018-02-10: qty 10

## 2018-02-10 MED ORDER — MENTHOL 3 MG MT LOZG: 1.0000 | LOZENGE | OROMUCOSAL | Status: DC | PRN

## 2018-02-10 MED ORDER — HYDROCODONE-ACETAMINOPHEN 10-325 MG PO TABS: 1.0000 | ORAL_TABLET | Freq: Four times a day (QID) | ORAL | 0 refills | Status: DC | PRN

## 2018-02-10 MED ORDER — MEPERIDINE HCL 50 MG/ML IJ SOLN: 6.2500 mg | INTRAMUSCULAR | Status: DC | PRN

## 2018-02-10 MED ORDER — ACETAMINOPHEN 500 MG PO TABS
500.0000 mg | ORAL_TABLET | Freq: Four times a day (QID) | ORAL | Status: AC
  Administered 2018-02-10 – 2018-02-11 (×4): 500 mg via ORAL
  Filled 2018-02-10 (×4): qty 1

## 2018-02-10 MED ORDER — KETOROLAC TROMETHAMINE 30 MG/ML IJ SOLN
INTRAMUSCULAR | Status: DC | PRN
  Administered 2018-02-10: 30 mg via INTRAVENOUS

## 2018-02-10 MED ORDER — METOCLOPRAMIDE HCL 5 MG PO TABS: 5.0000 mg | ORAL_TABLET | Freq: Three times a day (TID) | ORAL | Status: DC | PRN

## 2018-02-10 MED ORDER — LISINOPRIL 20 MG PO TABS
20.0000 mg | ORAL_TABLET | Freq: Every day | ORAL | Status: DC
  Administered 2018-02-10 – 2018-02-11 (×2): 20 mg via ORAL
  Filled 2018-02-10 (×2): qty 1

## 2018-02-10 MED ORDER — FENTANYL CITRATE (PF) 100 MCG/2ML IJ SOLN
100.0000 ug | Freq: Once | INTRAMUSCULAR | Status: AC
  Administered 2018-02-10: 100 ug via INTRAVENOUS

## 2018-02-10 MED ORDER — CEFAZOLIN SODIUM-DEXTROSE 2-4 GM/100ML-% IV SOLN
INTRAVENOUS | Status: AC
  Filled 2018-02-10: qty 100

## 2018-02-10 MED ORDER — SODIUM CHLORIDE 0.9 % IR SOLN
Status: DC | PRN
  Administered 2018-02-10: 1000 mL

## 2018-02-10 MED ORDER — CEFAZOLIN SODIUM-DEXTROSE 2-4 GM/100ML-% IV SOLN
2.0000 g | Freq: Four times a day (QID) | INTRAVENOUS | Status: AC
  Administered 2018-02-10 (×2): 2 g via INTRAVENOUS
  Filled 2018-02-10 (×3): qty 100

## 2018-02-10 MED ORDER — MAGNESIUM CITRATE PO SOLN: 1.0000 | Freq: Once | ORAL | Status: DC | PRN

## 2018-02-10 MED ORDER — HYDROCHLOROTHIAZIDE 25 MG PO TABS
25.0000 mg | ORAL_TABLET | Freq: Every day | ORAL | Status: DC
  Administered 2018-02-10 – 2018-02-11 (×2): 25 mg via ORAL
  Filled 2018-02-10 (×2): qty 1

## 2018-02-10 MED ORDER — HYDROCODONE-ACETAMINOPHEN 7.5-325 MG PO TABS: 1.0000 | ORAL_TABLET | ORAL | Status: DC | PRN

## 2018-02-10 MED ORDER — ONDANSETRON HCL 4 MG PO TABS: 4.0000 mg | ORAL_TABLET | Freq: Four times a day (QID) | ORAL | Status: DC | PRN

## 2018-02-10 MED ORDER — HYDROCODONE-ACETAMINOPHEN 5-325 MG PO TABS
1.0000 | ORAL_TABLET | ORAL | Status: DC | PRN
  Administered 2018-02-10: 1 via ORAL
  Administered 2018-02-11: 2 via ORAL
  Filled 2018-02-10: qty 1
  Filled 2018-02-10: qty 2

## 2018-02-10 MED ORDER — PHENOL 1.4 % MT LIQD: 1.0000 | OROMUCOSAL | Status: DC | PRN

## 2018-02-10 MED ORDER — DOCUSATE SODIUM 100 MG PO CAPS
100.0000 mg | ORAL_CAPSULE | Freq: Two times a day (BID) | ORAL | Status: DC
  Administered 2018-02-10 – 2018-02-11 (×3): 100 mg via ORAL
  Filled 2018-02-10 (×3): qty 1

## 2018-02-10 MED ORDER — ONDANSETRON HCL 4 MG/2ML IJ SOLN
4.0000 mg | Freq: Four times a day (QID) | INTRAMUSCULAR | Status: DC | PRN
  Administered 2018-02-10: 4 mg via INTRAVENOUS
  Filled 2018-02-10: qty 2

## 2018-02-10 MED ORDER — GABAPENTIN 300 MG PO CAPS
300.0000 mg | ORAL_CAPSULE | Freq: Two times a day (BID) | ORAL | Status: DC
  Administered 2018-02-10 – 2018-02-11 (×2): 300 mg via ORAL
  Filled 2018-02-10 (×2): qty 1

## 2018-02-10 MED ORDER — TRANEXAMIC ACID 1000 MG/10ML IV SOLN
1000.0000 mg | INTRAVENOUS | Status: AC
  Administered 2018-02-10: 1000 mg via INTRAVENOUS
  Filled 2018-02-10: qty 1000

## 2018-02-10 MED ORDER — ATORVASTATIN CALCIUM 20 MG PO TABS
20.0000 mg | ORAL_TABLET | Freq: Every day | ORAL | Status: DC
  Administered 2018-02-10: 20 mg via ORAL
  Filled 2018-02-10: qty 1

## 2018-02-10 MED ORDER — MIDAZOLAM HCL 2 MG/2ML IJ SOLN
2.0000 mg | Freq: Once | INTRAMUSCULAR | Status: AC
  Administered 2018-02-10: 2 mg via INTRAVENOUS

## 2018-02-10 MED ORDER — BISACODYL 10 MG RE SUPP: 10.0000 mg | Freq: Every day | RECTAL | Status: DC | PRN

## 2018-02-10 MED ORDER — BUPIVACAINE HCL (PF) 0.25 % IJ SOLN
INTRAMUSCULAR | Status: DC | PRN
  Administered 2018-02-10: 20 mL

## 2018-02-10 MED ORDER — MIDAZOLAM HCL 2 MG/2ML IJ SOLN
INTRAMUSCULAR | Status: AC
  Filled 2018-02-10: qty 2

## 2018-02-10 MED ORDER — INSULIN ASPART 100 UNIT/ML ~~LOC~~ SOLN
0.0000 [IU] | Freq: Three times a day (TID) | SUBCUTANEOUS | Status: DC
  Administered 2018-02-10 – 2018-02-11 (×2): 2 [IU] via SUBCUTANEOUS

## 2018-02-10 MED ORDER — MIDAZOLAM HCL 2 MG/2ML IJ SOLN
INTRAMUSCULAR | Status: AC
  Administered 2018-02-10: 2 mg via INTRAVENOUS
  Filled 2018-02-10: qty 2

## 2018-02-10 MED ORDER — LACTATED RINGERS IV SOLN
INTRAVENOUS | Status: DC
  Administered 2018-02-10: 09:00:00 via INTRAVENOUS

## 2018-02-10 MED ORDER — POTASSIUM CHLORIDE IN NACL 20-0.45 MEQ/L-% IV SOLN
INTRAVENOUS | Status: DC
  Administered 2018-02-10 – 2018-02-11 (×2): via INTRAVENOUS
  Filled 2018-02-10 (×2): qty 1000

## 2018-02-10 MED ORDER — ASPIRIN EC 325 MG PO TBEC: 325.0000 mg | DELAYED_RELEASE_TABLET | Freq: Every day | ORAL | 0 refills | Status: DC

## 2018-02-10 MED ORDER — FENTANYL CITRATE (PF) 250 MCG/5ML IJ SOLN
INTRAMUSCULAR | Status: AC
  Filled 2018-02-10: qty 5

## 2018-02-10 MED ORDER — MORPHINE SULFATE (PF) 2 MG/ML IV SOLN: 0.5000 mg | INTRAVENOUS | Status: DC | PRN

## 2018-02-10 SURGICAL SUPPLY — 54 items
BANDAGE ESMARK 6X9 LF (GAUZE/BANDAGES/DRESSINGS) ×1 IMPLANT
BEARING TIBIAL STRL SZ3 LG (Knees) ×3 IMPLANT
BNDG ELASTIC 6X10 VLCR STRL LF (GAUZE/BANDAGES/DRESSINGS) ×3 IMPLANT
BNDG ELASTIC 6X15 VLCR STRL LF (GAUZE/BANDAGES/DRESSINGS) ×3 IMPLANT
BNDG ESMARK 6X9 LF (GAUZE/BANDAGES/DRESSINGS) ×3
BOWL SMART MIX CTS (DISPOSABLE) ×3 IMPLANT
CEMENT BONE R 1X40 (Cement) ×3 IMPLANT
CLOSURE STERI-STRIP 1/2X4 (GAUZE/BANDAGES/DRESSINGS) ×1
CLOSURE WOUND 1/2 X4 (GAUZE/BANDAGES/DRESSINGS) ×1
CLSR STERI-STRIP ANTIMIC 1/2X4 (GAUZE/BANDAGES/DRESSINGS) ×2 IMPLANT
COMPONENT TIBIA MEDL OXFRD LFT (Joint) ×1 IMPLANT
COVER SURGICAL LIGHT HANDLE (MISCELLANEOUS) ×3 IMPLANT
CUFF TOURNIQUET SINGLE 34IN LL (TOURNIQUET CUFF) ×3 IMPLANT
DRAPE EXTREMITY T 121X128X90 (DRAPE) ×3 IMPLANT
DRAPE HALF SHEET 40X57 (DRAPES) ×3 IMPLANT
DRAPE U-SHAPE 47X51 STRL (DRAPES) ×3 IMPLANT
DRSG MEPILEX BORDER 4X8 (GAUZE/BANDAGES/DRESSINGS) ×3 IMPLANT
DURAPREP 26ML APPLICATOR (WOUND CARE) ×3 IMPLANT
ELECT CAUTERY BLADE 6.4 (BLADE) ×3 IMPLANT
ELECT REM PT RETURN 9FT ADLT (ELECTROSURGICAL) ×3
ELECTRODE REM PT RTRN 9FT ADLT (ELECTROSURGICAL) ×1 IMPLANT
GLOVE BIOGEL PI ORTHO PRO SZ8 (GLOVE) ×4
GLOVE ORTHO TXT STRL SZ7.5 (GLOVE) ×3 IMPLANT
GLOVE PI ORTHO PRO STRL SZ8 (GLOVE) ×2 IMPLANT
GLOVE SURG ORTHO 8.0 STRL STRW (GLOVE) ×3 IMPLANT
GOWN STRL REUS W/ TWL XL LVL3 (GOWN DISPOSABLE) ×1 IMPLANT
GOWN STRL REUS W/TWL 2XL LVL3 (GOWN DISPOSABLE) ×3 IMPLANT
GOWN STRL REUS W/TWL XL LVL3 (GOWN DISPOSABLE) ×2
HANDPIECE INTERPULSE COAX TIP (DISPOSABLE) ×2
HOOD PEEL AWAY FACE SHEILD DIS (HOOD) ×3 IMPLANT
HOOD PEEL AWAY FLYTE STAYCOOL (MISCELLANEOUS) ×3 IMPLANT
IMMOBILIZER KNEE 22 (SOFTGOODS) ×3 IMPLANT
IMMOBILIZER KNEE 22 UNIV (SOFTGOODS) ×3 IMPLANT
KIT BASIN OR (CUSTOM PROCEDURE TRAY) ×3 IMPLANT
KIT TURNOVER KIT B (KITS) ×3 IMPLANT
MANIFOLD NEPTUNE II (INSTRUMENTS) ×3 IMPLANT
NEEDLE HYPO 21X1.5 SAFETY (NEEDLE) IMPLANT
NS IRRIG 1000ML POUR BTL (IV SOLUTION) ×3 IMPLANT
PACK BLADE SAW RECIP 70 3 PT (BLADE) ×3 IMPLANT
PACK TOTAL JOINT (CUSTOM PROCEDURE TRAY) ×3 IMPLANT
PAD ARMBOARD 7.5X6 YLW CONV (MISCELLANEOUS) ×3 IMPLANT
PEG FEMORAL CEMENT STRL LRG (Knees) ×3 IMPLANT
SET HNDPC FAN SPRY TIP SCT (DISPOSABLE) ×1 IMPLANT
STRIP CLOSURE SKIN 1/2X4 (GAUZE/BANDAGES/DRESSINGS) ×2 IMPLANT
SUCTION FRAZIER HANDLE 10FR (MISCELLANEOUS) ×2
SUCTION TUBE FRAZIER 10FR DISP (MISCELLANEOUS) ×1 IMPLANT
SUT VIC AB 0 CT1 27 (SUTURE) ×2
SUT VIC AB 0 CT1 27XBRD ANBCTR (SUTURE) ×1 IMPLANT
SUT VIC AB 1 CT1 27 (SUTURE) ×2
SUT VIC AB 1 CT1 27XBRD ANBCTR (SUTURE) ×1 IMPLANT
SUT VIC AB 3-0 SH 8-18 (SUTURE) ×3 IMPLANT
SYR CONTROL 10ML LL (SYRINGE) ×3 IMPLANT
TIBIA MEDIAL OXFORD LEFT (Joint) ×3 IMPLANT
TOWEL OR 17X26 10 PK STRL BLUE (TOWEL DISPOSABLE) ×3 IMPLANT

## 2018-02-10 NOTE — H&P (Signed)
PREOPERATIVE H&P  Chief Complaint: left knee pain  HPI: Derek Lara is a 67 y.o. male who presents for preoperative history and physical with a diagnosis of left knee anteromedial osteoarthritis. Symptoms are rated as moderate to severe, and have been worsening.  This is significantly impairing activities of daily living.  He has elected for surgical management.   He has failed injections, activity modification, anti-inflammatories, and assistive devices.  Preoperative X-rays demonstrate end stage degenerative changes with osteophyte formation, loss of joint space, subchondral sclerosis.  Past Medical History:  Diagnosis Date  . Diabetes mellitus without complication (HCC)   . Hypertension    Past Surgical History:  Procedure Laterality Date  . CATARACT EXTRACTION W/ INTRAOCULAR LENS  IMPLANT, BILATERAL    . LEG SURGERY     vascular   Social History   Socioeconomic History  . Marital status: Married    Spouse name: Not on file  . Number of children: Not on file  . Years of education: Not on file  . Highest education level: Not on file  Occupational History  . Not on file  Social Needs  . Financial resource strain: Not on file  . Food insecurity:    Worry: Not on file    Inability: Not on file  . Transportation needs:    Medical: Not on file    Non-medical: Not on file  Tobacco Use  . Smoking status: Never Smoker  . Smokeless tobacco: Never Used  Substance and Sexual Activity  . Alcohol use: Yes    Comment: occ  . Drug use: Never  . Sexual activity: Not on file  Lifestyle  . Physical activity:    Days per week: Not on file    Minutes per session: Not on file  . Stress: Not on file  Relationships  . Social connections:    Talks on phone: Not on file    Gets together: Not on file    Attends religious service: Not on file    Active member of club or organization: Not on file    Attends meetings of clubs or organizations: Not on file    Relationship  status: Not on file  Other Topics Concern  . Not on file  Social History Narrative  . Not on file   History reviewed. No pertinent family history. No Known Allergies Prior to Admission medications   Medication Sig Start Date End Date Taking? Authorizing Provider  atorvastatin (LIPITOR) 20 MG tablet Take 20 mg by mouth daily after supper. 01/07/18  Yes [provider]  gabapentin (NEURONTIN) 300 MG capsule Take 300 mg by mouth 2 (two) times daily. 11/15/17  Yes [provider]  lisinopril-hydrochlorothiazide (PRINZIDE,ZESTORETIC) 20-25 MG tablet Take 1 tablet by mouth daily. 11/15/17  Yes [provider]  meloxicam (MOBIC) 15 MG tablet Take 15 mg by mouth daily as needed for pain. 11/16/17  Yes [provider]  metFORMIN (GLUCOPHAGE) 500 MG tablet Take 500 mg by mouth 2 (two) times daily. 12/22/17  Yes [provider]  metoprolol tartrate (LOPRESSOR) 50 MG tablet Take 50 mg by mouth 2 (two) times daily. 12/22/17  Yes [provider]     Positive ROS: All other systems have been reviewed and were otherwise negative with the exception of those mentioned in the HPI and as above.  Physical Exam: General: Alert, no acute distress Cardiovascular: No pedal edema Respiratory: No cyanosis, no use of accessory musculature GI: No organomegaly, abdomen is soft and non-tender Skin: No  lesions in the area of chief complaint Neurologic: Sensation intact distally Psychiatric: Patient is competent for consent with normal mood and affect Lymphatic: No axillary or cervical lymphadenopathy  MUSCULOSKELETAL: left knee with varus and 3-110 degrees and crepitance and painful arc  Assessment: Left knee anteromedial osteoarthritis with morbid obesity and venous stasis chronically   Plan: Plan for Procedure(s): LEFT UNICOMPARTMENTAL KNEE  The risks benefits and alternatives were discussed with the patient including but not limited to the risks of nonoperative  treatment, versus surgical intervention including infection, bleeding, nerve injury,  blood clots, cardiopulmonary complications, morbidity, mortality, among others, and they were willing to proceed.    Patient's anticipated LOS is less than 2 midnights, meeting these requirements: - Younger than 5265 - Lives within 1 hour of care - Has a competent adult at home to recover with post-op recover - NO history of  - Chronic pain requiring opiods  - Diabetes  - Coronary Artery Disease  - Heart failure  - Heart attack  - Stroke  - DVT/VTE  - Cardiac arrhythmia  - Respiratory Failure/COPD  - Renal failure  - Anemia  - Advanced Liver disease  Also discussed significant risk of swelling and lymphedema given venous stasis.     Preoperative templating of the joint replacement has been completed, documented, and submitted to the Operating Room personnel in order to optimize intra-operative equipment management.  Eulas PostJoshua P Tilford Deaton, MD Cell [phone]   02/10/2018 9:18 AM

## 2018-02-10 NOTE — Evaluation (Signed)
Physical Therapy Evaluation Patient Details Name: Derek Lara MRN: [id number] DOB: 08-29-1950 Today's Date: 02/10/2018   History of Present Illness  Pt is a 67 y/o male s/p elective L unicompartmental knee replacement. PMH includes DM and HTN.   Clinical Impression  Pt is s/p surgery above with deficits below. Gait distance limited within the room secondary to pain. Pt requiring min to min guard A for ambulation with RW and mod A to stand. Educated about knee precautions and supine HEP. Will continue to follow acutely to maximize functional mobility independence and safety.     Follow Up Recommendations Follow surgeon's recommendation for DC plan and follow-up therapies;Supervision for mobility/OOB    Equipment Recommendations  Rolling walker with 5" wheels;3in1 (PT)    Recommendations for Other Services OT consult     Precautions / Restrictions Precautions Precautions: Knee Precaution Booklet Issued: Yes (comment) Precaution Comments: Reviewed knee precautions and supine HEP.  Restrictions Weight Bearing Restrictions: Yes LLE Weight Bearing: Weight bearing as tolerated      Mobility  Bed Mobility Overal bed mobility: Needs Assistance Bed Mobility: Supine to Sit     Supine to sit: Supervision     General bed mobility comments: Supervision for safety. Increased time required.   Transfers Overall transfer level: Needs assistance Equipment used: Rolling walker (2 wheeled) Transfers: Sit to/from Stand Sit to Stand: Mod assist         General transfer comment: Mod A for lift assist and steadying. Verbal cues for safe hand placement.   Ambulation/Gait Ambulation/Gait assistance: Min guard;Min assist Gait Distance (Feet): 10 Feet Assistive device: Rolling walker (2 wheeled) Gait Pattern/deviations: Step-to pattern;Decreased step length - right;Decreased step length - left;Decreased weight shift to right;Antalgic Gait velocity: Decreased    General Gait  Details: Slow, antalgic gait. Verbal cues for sequencing using RW. Distance limited within the room secondary to pain.   Stairs            Wheelchair Mobility    Modified Rankin (Stroke Patients Only)       Balance Overall balance assessment: Needs assistance Sitting-balance support: No upper extremity supported;Feet supported Sitting balance-Leahy Scale: Good     Standing balance support: Bilateral upper extremity supported;During functional activity Standing balance-Leahy Scale: Poor Standing balance comment: Reliant on BUE support.                              Pertinent Vitals/Pain Pain Assessment: Faces Faces Pain Scale: Hurts even more Pain Location: L knee  Pain Descriptors / Indicators: Aching;Operative site guarding Pain Intervention(s): Limited activity within patient's tolerance;Monitored during session;Repositioned    Home Living Family/patient expects to be discharged to:: Private residence Living Arrangements: Spouse/significant other Available Help at Discharge: Family;Available 24 hours/day Type of Home: House Home Access: Stairs to enter Entrance Stairs-Rails: None Entrance Stairs-Number of Steps: 1(threshold step ) Home Layout: Two level Home Equipment: None      Prior Function Level of Independence: Independent               Hand Dominance        Extremity/Trunk Assessment   Upper Extremity Assessment Upper Extremity Assessment: Defer to OT evaluation    Lower Extremity Assessment Lower Extremity Assessment: RLE deficits/detail RLE Deficits / Details: Sensory in tact. Deficits consistent with post op pain and weakness. Able to perform ther ex below.     Cervical / Trunk Assessment Cervical / Trunk Assessment: Normal  Communication  Communication: No difficulties  Cognition Arousal/Alertness: Awake/alert Behavior During Therapy: WFL for tasks assessed/performed Overall Cognitive Status: Within Functional Limits  for tasks assessed                                        General Comments General comments (skin integrity, edema, etc.): Pt's wife present during session.     Exercises Total Joint Exercises Ankle Circles/Pumps: AROM;Both;20 reps Quad Sets: AROM;Left;10 reps Heel Slides: AROM;Left;10 reps(partial range)   Assessment/Plan    PT Assessment Patient needs continued PT services  PT Problem List Decreased strength;Decreased activity tolerance;Decreased range of motion;Decreased balance;Decreased mobility;Decreased knowledge of use of DME;Decreased knowledge of precautions;Pain       PT Treatment Interventions DME instruction;Gait training;Stair training;Therapeutic activities;Functional mobility training;Therapeutic exercise;Balance training;Patient/family education    PT Goals (Current goals can be found in the Care Plan section)  Acute Rehab PT Goals Patient Stated Goal: to go home  PT Goal Formulation: With patient Time For Goal Achievement: 02/24/18 Potential to Achieve Goals: Good    Frequency 7X/week   Barriers to discharge        Co-evaluation               AM-PAC PT "6 Clicks" Daily Activity  Outcome Measure Difficulty turning over in bed (including adjusting bedclothes, sheets and blankets)?: A Little Difficulty moving from lying on back to sitting on the side of the bed? : A Little Difficulty sitting down on and standing up from a chair with arms (e.g., wheelchair, bedside commode, etc,.)?: Unable Help needed moving to and from a bed to chair (including a wheelchair)?: A Little Help needed walking in hospital room?: A Little Help needed climbing 3-5 steps with a railing? : A Lot 6 Click Score: 15    End of Session Equipment Utilized During Treatment: Gait belt;Left knee immobilizer Activity Tolerance: Patient limited by pain Patient left: in chair;with call bell/phone within reach Nurse Communication: Mobility status PT Visit Diagnosis:  Other abnormalities of gait and mobility (R26.89);Muscle weakness (generalized) (M62.81);Pain Pain - Right/Left: Left Pain - part of body: Knee    Time: [id number] PT Time Calculation (min) (ACUTE ONLY): 19 min   Charges:   PT Evaluation $PT Eval Low Complexity: 1 Low          Gladys DammeBrittany Jaymeson Mengel, PT, DPT  Acute Rehabilitation Services  Pager: [phone]   Lehman PromBrittany S Copeland Lapier 02/10/2018, 4:55 PM

## 2018-02-10 NOTE — Discharge Instructions (Signed)

## 2018-02-10 NOTE — Op Note (Signed)
02/10/2018  11:39 AM  PATIENT:  Derek Lara    PRE-OPERATIVE DIAGNOSIS:  Left knee anteromedial primary localized osteoarthritis  POST-OPERATIVE DIAGNOSIS:  Same  PROCEDURE:  Unicompartmental Knee Arthroplasty  SURGEON:  Eulas PostJoshua P Jauan Wohl, MD  PHYSICIAN ASSISTANT: Janace LittenBrandon Parry, OPA-C, present and scrubbed throughout the case, critical for completion in a timely fashion, and for retraction, instrumentation, and closure.  ANESTHESIA:   Spinal with adductor canal block and intraoperative injection of marcaine and toradol  ESTIMATED BLOOD LOSS: 100 ml  UNIQUE ASPECTS OF THE CASE:  The knee was fairly tight.  I considered cutting 2 more off the tibia, but in the end he fit a 3 well.  The femur was much larger front to back than medial to lateral, and I did fit, but had to adjust the spaceship position slightly lateral to where it wanted to be, because it tried to put the anterior phlange off the line of the condyle.    PREOPERATIVE INDICATIONS:  Derek Lara is a  67 y.o. male with a diagnosis of OA LEFT KNEE who failed conservative measures and elected for surgical management.    The risks benefits and alternatives were discussed with the patient preoperatively including but not limited to the risks of infection, bleeding, nerve injury, cardiopulmonary complications, blood clots, the need for revision surgery, among others, and the patient was willing to proceed.  OPERATIVE IMPLANTS: [employer] mobile bearing medial compartment arthroplasty femur size large, tibia size C, bearing size 3.  OPERATIVE FINDINGS: Endstage grade 4 medial compartment osteoarthritis.   The ACL was intact.  There was grade 2 and a little grade 3 changes on the femoral trochlea, but mostly medially, a little bit centrally.  The patellar and lateral surface was in good condition.    OPERATIVE PROCEDURE: The patient was brought to the operating room placed in the supine position. Spinal anesthesia was  administered. IV antibiotics were given. The lower extremity was placed in the legholder and prepped and draped in usual sterile fashion.  Time out was performed.  The leg was elevated and exsanguinated and the tourniquet was inflated. Anteromedial incision was performed, and I took care to preserve the MCL. Parapatellar incision was carried out, and the osteophytes were excised, along with the medial meniscus and a small portion of the fat pad.  The extra medullary tibial cutting jig was applied, using the spoon and the 4mm G-Clamp and the 2 mm shim, and I took care to protect the anterior cruciate ligament insertion and the tibial spine. The medial collateral ligament was also protected, and I resected my proximal tibia, matching the anatomic slope.   The proximal tibial bony cut was removed in one piece, and I turned my attention to the femur.  The intramedullary femoral rod was placed using the drill, and then using the appropriate reference, I assembled the femoral jig, setting my posterior cutting block. I resected my posterior femur, used the 0 spigot for the anterior femur, and then measured my gap.   I then used the appropriate mill to match the extension gap to the flexion gap. The second milling was at a 3.  The gaps were then measured again with the appropriate feeler gauges. Once I had balanced flexion and extension gaps, I then completed the preparation of the femur.  I milled off the anterior aspect of the distal femur to prevent impingement. I also exposed the tibia, and selected the above-named component, and then used the cutting jig to prepare the keel  slot on the tibia. I also used the awl to curette out the bone to complete the preparation of the keel. The back wall was intact.  I then placed trial components, and it was found to have excellent motion, and appropriate balance.  I then cemented the components into place, cementing the tibia first, removing all excess cement, and  then cementing the femur.  All loose cement was removed.  The real polyethylene insert was applied manually, and the knee was taken through functional range of motion, and found to have excellent stability and restoration of joint motion, with excellent balance.  The wounds were irrigated copiously, and the parapatellar tissue closed with Vicryl, followed by Vicryl for the subcutaneous tissue, with routine closure with Steri-Strips and sterile gauze.  The tourniquet was released, and the patient was awakened and extubated and returned to PACU in stable and satisfactory condition. There were no complications.

## 2018-02-10 NOTE — Progress Notes (Signed)
Spoke with Dr. Arby BarretteHatchett and no new lab orders received.

## 2018-02-10 NOTE — Transfer of Care (Signed)
Immediate Anesthesia Transfer of Care Note  Patient: Derek Lara  Procedure(s) Performed: LEFT UNICOMPARTMENTAL KNEE (Left Knee)  Patient Location: PACU  Anesthesia Type:Spinal  Level of Consciousness: drowsy  Airway & Oxygen Therapy: Patient Spontanous Breathing and Patient connected to face mask oxygen  Post-op Assessment: Report given to RN and Post -op Vital signs reviewed and stable  Post vital signs: Reviewed and stable  Last Vitals:  Vitals Value Taken Time  BP 116/59 02/10/2018 12:22 PM  Temp    Pulse 68 02/10/2018 12:27 PM  Resp 16 02/10/2018 12:27 PM  SpO2 100 % 02/10/2018 12:27 PM  Vitals shown include unvalidated device data.  Last Pain:  Vitals:   02/10/18 0836  TempSrc:   PainSc: 0-No pain         Complications: No apparent anesthesia complications

## 2018-02-10 NOTE — Anesthesia Postprocedure Evaluation (Signed)
Anesthesia Post Note  Patient: Derek Lara  Procedure(s) Performed: LEFT UNICOMPARTMENTAL KNEE (Left Knee)     Patient location during evaluation: PACU Anesthesia Type: Spinal Level of consciousness: awake Pain management: pain level controlled Vital Signs Assessment: post-procedure vital signs reviewed and stable Respiratory status: spontaneous breathing Cardiovascular status: stable Postop Assessment: no headache, no backache, spinal receding, patient able to bend at knees and no apparent nausea or vomiting Anesthetic complications: no    Last Vitals:  Vitals:   02/10/18 1307 02/10/18 1322  BP: 131/86 130/79  Pulse:  (!) 56  Resp: 11 15  Temp:    SpO2: 100% 100%    Last Pain:  Vitals:   02/10/18 1322  TempSrc:   PainSc: 0-No pain   Pain Goal:    LLE Motor Response: Purposeful movement (02/10/18 1322) LLE Sensation: Decreased;Numbness (02/10/18 1322) RLE Motor Response: Purposeful movement (02/10/18 1322) RLE Sensation: Decreased;Numbness (02/10/18 1322) L Sensory Level: S1-Sole of foot, small toes (02/10/18 1322) R Sensory Level: S1-Sole of foot, small toes (02/10/18 1322)  Derek Lara,Derek Lara Kimla Furth

## 2018-02-10 NOTE — Anesthesia Procedure Notes (Signed)
Procedure Name: MAC Date/Time: 02/10/2018 10:14 AM Performed by: Colin Benton, CRNA Pre-anesthesia Checklist: Patient identified, Emergency Drugs available, Suction available and Patient being monitored Patient Re-evaluated:Patient Re-evaluated prior to induction Oxygen Delivery Method: Simple face mask Preoxygenation: Pre-oxygenation with 100% oxygen Induction Type: IV induction Placement Confirmation: positive ETCO2 Dental Injury: Teeth and Oropharynx as per pre-operative assessment

## 2018-02-10 NOTE — Anesthesia Procedure Notes (Signed)
Anesthesia Regional Block: Adductor canal block   Pre-Anesthetic Checklist: ,, timeout performed, Correct Patient, Correct Site, Correct Laterality, Correct Procedure, Correct Position, site marked, Risks and benefits discussed,  Surgical consent,  Pre-op evaluation,  At surgeon's request and post-op pain management  Laterality: Left and Lower  Prep: chloraprep       Needles:  Injection technique: Single-shot  Needle Type: Echogenic Stimulator Needle     Needle Length: 10cm  Needle Gauge: 21   Needle insertion depth: 3.5 cm   Additional Needles:   Procedures:,,,, ultrasound used (permanent image in chart),,,,  Narrative:  Start time: 02/10/2018 9:24 AM End time: 02/10/2018 9:34 AM Injection made incrementally with aspirations every 5 mL.  Events: blood aspirated,,,,,,,,,,  Performed by: Personally  Anesthesiologist: Leilani AbleHatchett, Icy Fuhrmann, MD

## 2018-02-10 NOTE — Anesthesia Procedure Notes (Signed)
Spinal  Start time: 02/10/2018 10:09 AM End time: 02/10/2018 10:13 AM Staffing Anesthesiologist: Leilani AbleHatchett, Ginna Schuur, MD Preanesthetic Checklist Completed: patient identified, site marked, surgical consent, pre-op evaluation, timeout performed, IV checked, risks and benefits discussed and monitors and equipment checked Spinal Block Patient position: sitting Prep: site prepped and draped and DuraPrep Patient monitoring: continuous pulse ox and blood pressure Approach: midline Location: L3-4 Injection technique: single-shot Needle Needle type: Pencan  Needle gauge: 24 G Needle length: 10 cm Needle insertion depth: 9 cm Assessment Sensory level: T6

## 2018-02-11 ENCOUNTER — Encounter (HOSPITAL_COMMUNITY): Payer: Self-pay | Admitting: Orthopedic Surgery

## 2018-02-11 ENCOUNTER — Other Ambulatory Visit: Payer: Self-pay

## 2018-02-11 DIAGNOSIS — M1712 Unilateral primary osteoarthritis, left knee: Secondary | ICD-10-CM | POA: Diagnosis not present

## 2018-02-11 LAB — GLUCOSE, CAPILLARY
GLUCOSE-CAPILLARY: 119 mg/dL — AB (ref 70–99)
GLUCOSE-CAPILLARY: 123 mg/dL — AB (ref 70–99)
Glucose-Capillary: 97 mg/dL (ref 70–99)

## 2018-02-11 LAB — CBC
HCT: 38.1 % — ABNORMAL LOW (ref 39.0–52.0)
Hemoglobin: 12.1 g/dL — ABNORMAL LOW (ref 13.0–17.0)
MCH: 30.9 pg (ref 26.0–34.0)
MCHC: 31.8 g/dL (ref 30.0–36.0)
MCV: 97.4 fL (ref 78.0–100.0)
PLATELETS: 243 10*3/uL (ref 150–400)
RBC: 3.91 MIL/uL — AB (ref 4.22–5.81)
RDW: 13 % (ref 11.5–15.5)
WBC: 8.7 10*3/uL (ref 4.0–10.5)

## 2018-02-11 LAB — BASIC METABOLIC PANEL
Anion gap: 6 (ref 5–15)
BUN: 25 mg/dL — AB (ref 8–23)
CALCIUM: 8.8 mg/dL — AB (ref 8.9–10.3)
CO2: 25 mmol/L (ref 22–32)
CREATININE: 1.69 mg/dL — AB (ref 0.61–1.24)
Chloride: 108 mmol/L (ref 98–111)
GFR calc Af Amer: 47 mL/min — ABNORMAL LOW (ref >60)
GFR calc non Af Amer: 41 mL/min — ABNORMAL LOW (ref >60)
GLUCOSE: 111 mg/dL — AB (ref 70–99)
Potassium: 4.3 mmol/L (ref 3.5–5.1)
Sodium: 139 mmol/L (ref 135–145)

## 2018-02-11 NOTE — Plan of Care (Signed)

## 2018-02-11 NOTE — Progress Notes (Signed)
Physical Therapy Treatment & Discharge Patient Details Name: Derek Lara MRN: [id number] DOB: 10/21/1950 Today's Date: 02/11/2018    History of Present Illness Pt is a 67 y.o. male s/p elective L unicompartmental knee replacement on 02/10/18. PMH includes DM, HTN.    PT Comments    Pt seen for additional gait and stair training. Able to amb with RW and ascend/descend steps at supervision-level. Pt requires intermittent cues for safe sequencing with RW; son-in-law present with good grasp on this and will be present to provide assist if needed. Pt has met short-term acute PT goals. Has no further questions or concerns. Will d/c acute PT.    Follow Up Recommendations  Follow surgeon's recommendation for DC plan and follow-up therapies;Supervision for mobility/OOB     Equipment Recommendations  Rolling walker with 5" wheels;3in1 (PT)(already delivered to room)    Recommendations for Other Services       Precautions / Restrictions Precautions Precautions: Knee Precaution Booklet Issued: Yes (comment) Precaution Comments: reviewed knee precautions Restrictions Weight Bearing Restrictions: Yes LLE Weight Bearing: Weight bearing as tolerated    Mobility  Bed Mobility Overal bed mobility: Modified Independent Bed Mobility: Supine to Sit;Sit to Supine     Supine to sit: Supervision;HOB elevated     General bed mobility comments: Increased time and effort  Transfers Overall transfer level: Needs assistance Equipment used: Rolling walker (2 wheeled) Transfers: Sit to/from Stand Sit to Stand: Supervision         General transfer comment: Good technique and hand placement  Ambulation/Gait Ambulation/Gait assistance: Supervision Gait Distance (Feet): 200 Feet Assistive device: Rolling walker (2 wheeled) Gait Pattern/deviations: Step-through pattern;Decreased stride length;Decreased weight shift to left Gait velocity: Decreased Gait velocity interpretation: <1.8  ft/sec, indicate of risk for recurrent falls General Gait Details: In room amb with RW and supervision; good technique walking forwards, backwards, and sideways. Cues to keep proximity to RW at all times   Stairs Stairs: Yes Stairs assistance: Supervision Stair Management: Step to pattern;Backwards;Forwards;With walker Number of Stairs: 1 General stair comments: Ascend/descended 1 threshold step using RW; son in law present for education. Pt required repeated cues for correct sequencing; son-in-law has good grasp on technique and will be present when pt enters home   Wheelchair Mobility    Modified Rankin (Stroke Patients Only)       Balance Overall balance assessment: Needs assistance Sitting-balance support: No upper extremity supported;Feet supported Sitting balance-Leahy Scale: Good     Standing balance support: Bilateral upper extremity supported;During functional activity Standing balance-Leahy Scale: Poor Standing balance comment: Reliant on UE support                            Cognition Arousal/Alertness: Awake/alert Behavior During Therapy: WFL for tasks assessed/performed Overall Cognitive Status: Within Functional Limits for tasks assessed                                        Exercises      General Comments General comments (skin integrity, edema, etc.): Spouse and son-in-law present      Pertinent Vitals/Pain Pain Assessment: Faces Faces Pain Scale: Hurts a little bit Pain Location: L knee  Pain Descriptors / Indicators: Sore Pain Intervention(s): Monitored during session    Home Living Family/patient expects to be discharged to:: Private residence Living Arrangements: Spouse/significant other Available Help at Discharge: Family;Available 24  hours/day Type of Home: House Home Access: Stairs to enter Entrance Stairs-Rails: None Home Layout: Two level Home Equipment: None      Prior Function Level of Independence:  Independent          PT Goals (current goals can now be found in the care plan section) Acute Rehab PT Goals Patient Stated Goal: to go home PT Goal Formulation: With patient Time For Goal Achievement: 02/24/18 Potential to Achieve Goals: Good Progress towards PT goals: Progressing toward goals;Goals met/education completed, patient discharged from PT    Frequency    7X/week      PT Plan Current plan remains appropriate    Co-evaluation              AM-PAC PT "6 Clicks" Daily Activity  Outcome Measure  Difficulty turning over in bed (including adjusting bedclothes, sheets and blankets)?: None Difficulty moving from lying on back to sitting on the side of the bed? : None Difficulty sitting down on and standing up from a chair with arms (e.g., wheelchair, bedside commode, etc,.)?: A Little Help needed moving to and from a bed to chair (including a wheelchair)?: A Little Help needed walking in hospital room?: A Little Help needed climbing 3-5 steps with a railing? : A Little 6 Click Score: 20    End of Session Equipment Utilized During Treatment: Gait belt Activity Tolerance: Patient tolerated treatment well Patient left: in bed;with call bell/phone within reach;with family/visitor present Nurse Communication: Mobility status PT Visit Diagnosis: Other abnormalities of gait and mobility (R26.89);Pain Pain - Right/Left: Left Pain - part of body: Knee     Time: 1157-1209 PT Time Calculation (min) (ACUTE ONLY): 12 min  Charges:  $Gait Training: 8-22 mins                    Mabeline Caras, PT, DPT Acute Rehab Services  Pager: [place] 02/11/2018, 1:02 PM

## 2018-02-11 NOTE — Evaluation (Signed)
Occupational Therapy Evaluation Patient Details Name: Derek Lara MRN: [id number] DOB: 24-Dec-1950 Today's Date: 02/11/2018    History of Present Illness Pt is a 67 y/o male s/p elective L unicompartmental knee replacement. PMH includes DM and HTN.    Clinical Impression   PTA patient independent.  Currently, he requires setup assist for UB ADL seated, min assist for LB ADL, min guard for toilet transfers, min assist for shower transfers (unsuccessful with tub transfers), and min guard for grooming standing.  He is limited by impaired balance, pain in L knee, and decreased safety awareness. He was educated on precautions, safety, ADL compensatory techniques, recommendations, and DME. Pt educated on use of AE for LB dressing, but then reports his spouse will assist him; reports plans to basin bathe initially until cleared by MD and educated on techniques for tub vs shower as his walk in shower is on the 2nd floor. He plans to dc home with spouses support 24/7.  Will continue to follow while admitted, but no further services require after dc.        Follow Up Recommendations  No OT follow up;Supervision/Assistance - 24 hour    Equipment Recommendations  3 in 1 bedside commode    Recommendations for Other Services       Precautions / Restrictions Precautions Precautions: Knee Precaution Booklet Issued: Yes (comment) Precaution Comments: reviewed knee precautions Restrictions Weight Bearing Restrictions: Yes LLE Weight Bearing: Weight bearing as tolerated      Mobility Bed Mobility               General bed mobility comments: seated in recliner upon entry  Transfers Overall transfer level: Needs assistance Equipment used: Rolling walker (2 wheeled) Transfers: Sit to/from Stand Sit to Stand: Min guard         General transfer comment: min guard for safety, cueing for hand placement and technique initally    Balance Overall balance assessment: Needs  assistance Sitting-balance support: No upper extremity supported;Feet supported Sitting balance-Leahy Scale: Good     Standing balance support: Bilateral upper extremity supported;During functional activity Standing balance-Leahy Scale: Poor Standing balance comment: Reliant on BUE support.                            ADL either performed or assessed with clinical judgement   ADL Overall ADL's : Needs assistance/impaired     Grooming: Min guard;Standing   Upper Body Bathing: Set up;Sitting   Lower Body Bathing: Minimal assistance;Sit to/from stand Lower Body Bathing Details (indicate cue type and reason): decreased reach to L LE, requires increased time and effort; continued education to sponge bathe until cleared by MD Upper Body Dressing : Set up;Sitting   Lower Body Dressing: Minimal assistance;Sit to/from stand Lower Body Dressing Details (indicate cue type and reason): educated on AE due to decreased reach to L LE, patient reports plans to have spouse assist after AE education; min guard in standing  Toilet Transfer: Min guard;Ambulation;RW(simulated in room to recliner ) Toilet Transfer Details (indicate cue type and reason): increased time and effort, good recall of hand placement Toileting- Clothing Manipulation and Hygiene: Supervision/safety;Sit to/from stand   Tub/ Shower Transfer: Minimal assistance;Ambulation;Rolling walker;3 in 1(simulated in room ) Tub/Shower Transfer Details (indicate cue type and reason): educated on options for bathing; difficulty stepping over ledge of tub therefore educated on placement of 3:1 outward facing in tub, as walk in shower is upstairs; if able to complete mobility upstairs,  educated on technqiue with walking into walk in shower with RW and assistance to remove walker before bathing seated, patient agreeable to recommendations  Functional mobility during ADLs: Min guard;Rolling walker General ADL Comments: completed transfers  and ADLs     Vision Baseline Vision/History: Wears glasses Vision Assessment?: No apparent visual deficits     Perception     Praxis      Pertinent Vitals/Pain Pain Assessment: Faces Faces Pain Scale: Hurts little more Pain Location: L knee  Pain Descriptors / Indicators: Aching;Operative site guarding Pain Intervention(s): Limited activity within patient's tolerance;Repositioned     Hand Dominance     Extremity/Trunk Assessment Upper Extremity Assessment Upper Extremity Assessment: Overall WFL for tasks assessed   Lower Extremity Assessment Lower Extremity Assessment: Defer to PT evaluation   Cervical / Trunk Assessment Cervical / Trunk Assessment: Normal   Communication Communication Communication: No difficulties   Cognition Arousal/Alertness: Awake/alert Behavior During Therapy: WFL for tasks assessed/performed Overall Cognitive Status: Within Functional Limits for tasks assessed                                     General Comments  spouse present and supportive    Exercises     Shoulder Instructions      Home Living Family/patient expects to be discharged to:: Private residence Living Arrangements: Spouse/significant other Available Help at Discharge: Family;Available 24 hours/day Type of Home: House Home Access: Stairs to enter [employer] of Steps: 1(threshold step) Entrance Stairs-Rails: None Home Layout: Two level Alternate Level Stairs-Number of Steps: flight  Alternate Level Stairs-Rails: Left Bathroom Shower/Tub: Tub/shower unit;Walk-in shower(tub shower 1st floor, walk in shower 2nd floor)   Bathroom Toilet: Standard     Home Equipment: None          Prior Functioning/Environment Level of Independence: Independent                 OT Problem List: Decreased strength;Decreased activity tolerance;Impaired balance (sitting and/or standing);Decreased safety awareness;Decreased knowledge of use of DME or  AE;Decreased knowledge of precautions;Pain      OT Treatment/Interventions: Self-care/ADL training;Therapeutic exercise;DME and/or AE instruction;Therapeutic activities;Patient/family education;Balance training    OT Goals(Current goals can be found in the care plan section) Acute Rehab OT Goals Patient Stated Goal: to go home OT Goal Formulation: With patient Time For Goal Achievement: 02/18/18 Potential to Achieve Goals: Good  OT Frequency: Min 2X/week   Barriers to D/C:            Co-evaluation              AM-PAC PT "6 Clicks" Daily Activity     Outcome Measure Help from another person eating meals?: None Help from another person taking care of personal grooming?: A Little Help from another person toileting, which includes using toliet, bedpan, or urinal?: A Little Help from another person bathing (including washing, rinsing, drying)?: A Little Help from another person to put on and taking off regular upper body clothing?: None Help from another person to put on and taking off regular lower body clothing?: A Little 6 Click Score: 20   End of Session Equipment Utilized During Treatment: Gait belt;Rolling walker;Left knee immobilizer  Activity Tolerance: Patient tolerated treatment well Patient left: in chair;with call bell/phone within reach;with family/visitor present  OT Visit Diagnosis: Other abnormalities of gait and mobility (R26.89);Pain Pain - Right/Left: Left Pain - part of body: Knee  Time: [id number] OT Time Calculation (min): 25 min Charges:  OT General Charges $OT Visit: 1 Visit OT Evaluation $OT Eval Moderate Complexity: 1 Mod OT Treatments $Self Care/Home Management : 8-22 mins  Chancy Milroyhristie S Champ Keetch, OTR/L  Pager [phone]   Chancy MilroyChristie S Toula Miyasaki 02/11/2018, 10:08 AM

## 2018-02-11 NOTE — Discharge Summary (Signed)
Physician Discharge Summary  Patient ID: Derek Lara MRN: [id number] DOB/AGE: July 28, 1950 67 y.o.  Admit date: 02/10/2018 Discharge date: 02/11/2018  Admission Diagnoses:  Primary localized osteoarthritis of left knee  Discharge Diagnoses:  Principal Problem:   Primary localized osteoarthritis of left knee Active Problems:   S/P left unicompartmental knee replacement   Past Medical History:  Diagnosis Date  . Diabetes mellitus without complication (HCC)   . Hypertension   . Primary localized osteoarthritis of left knee 02/10/2018    Surgeries: Procedure(s): LEFT UNICOMPARTMENTAL KNEE on 02/10/2018   Consultants (if any):   Discharged Condition: Improved  Hospital Course: Derek Dusradeepkumar Rufener is an 67 y.o. male who was admitted 02/10/2018 with a diagnosis of Primary localized osteoarthritis of left knee and went to the operating room on 02/10/2018 and underwent the above named procedures.    He was given perioperative antibiotics:  Anti-infectives (From admission, onward)   Start     Dose/Rate Route Frequency Ordered Stop   02/10/18 1600  ceFAZolin (ANCEF) IVPB 2g/100 mL premix     2 g 200 mL/hr over 30 Minutes Intravenous Every 6 hours 02/10/18 1430 02/10/18 2232   02/10/18 0830  ceFAZolin (ANCEF) IVPB 2g/100 mL premix     2 g 200 mL/hr over 30 Minutes Intravenous On call to O.R. 02/10/18 0818 02/10/18 1009   02/10/18 0825  ceFAZolin (ANCEF) 2-4 GM/100ML-% IVPB    Note to Pharmacy:  Aquilla HackerWalton, Susan   : cabinet override      02/10/18 0825 02/10/18 1009    .  He was given sequential compression devices, early ambulation, and aspirin for DVT prophylaxis.  He benefited maximally from the hospital stay and there were no complications.    Recent vital signs:  Vitals:   02/10/18 2027 02/11/18 0036  BP: (!) 142/84 125/76  Pulse: 65 (!) 58  Resp:  16  Temp: 97.7 F (36.5 C) 98 F (36.7 C)  SpO2: 99% 99%    Recent laboratory studies:  Lab Results  Component Value  Date   HGB 12.1 (L) 02/11/2018   HGB 12.8 (L) 01/23/2018   Lab Results  Component Value Date   WBC 8.7 02/11/2018   PLT 243 02/11/2018   No results found for: INR Lab Results  Component Value Date   NA 139 02/11/2018   K 4.3 02/11/2018   CL 108 02/11/2018   CO2 25 02/11/2018   BUN 25 (H) 02/11/2018   CREATININE 1.69 (H) 02/11/2018   GLUCOSE 111 (H) 02/11/2018    Discharge Medications:   Allergies as of 02/11/2018   No Known Allergies     Medication List    TAKE these medications   aspirin EC 325 MG tablet Take 1 tablet (325 mg total) by mouth daily.   atorvastatin 20 MG tablet Commonly known as:  LIPITOR Take 20 mg by mouth daily after supper.   baclofen 10 MG tablet Commonly known as:  LIORESAL Take 1 tablet (10 mg total) by mouth 3 (three) times daily. As needed for muscle spasm   gabapentin 300 MG capsule Commonly known as:  NEURONTIN Take 300 mg by mouth 2 (two) times daily.   HYDROcodone-acetaminophen 10-325 MG tablet Commonly known as:  NORCO Take 1 tablet by mouth every 6 (six) hours as needed.   lisinopril-hydrochlorothiazide 20-25 MG tablet Commonly known as:  PRINZIDE,ZESTORETIC Take 1 tablet by mouth daily.   meloxicam 15 MG tablet Commonly known as:  MOBIC Take 15 mg by mouth daily as needed for pain.  metFORMIN 500 MG tablet Commonly known as:  GLUCOPHAGE Take 500 mg by mouth 2 (two) times daily.   metoprolol tartrate 50 MG tablet Commonly known as:  LOPRESSOR Take 50 mg by mouth 2 (two) times daily.   ondansetron 4 MG tablet Commonly known as:  ZOFRAN Take 1 tablet (4 mg total) by mouth every 8 (eight) hours as needed for nausea or vomiting.   sennosides-docusate sodium 8.6-50 MG tablet Commonly known as:  SENOKOT-S Take 2 tablets by mouth daily.       Diagnostic Studies: Dg Knee Left Port  Result Date: 02/10/2018 CLINICAL DATA:  Unicompartmental left knee replacement EXAM: PORTABLE LEFT KNEE - 1-2 VIEW COMPARISON:  Left knee  films of 04/30/2007 FINDINGS: Unicompartmental medial compartment left knee replacement has been performed. The lateral and patellofemoral compartments are relatively well preserved. A small amount air and fluid is noted in the joint space postoperatively. No complications are seen. IMPRESSION: Medial compartment unicompartmental left knee replacement. Electronically Signed   By: Dwyane Dee M.D.   On: 02/10/2018 13:30    Disposition:     Follow-up Information    Teryl Lucy, MD. Schedule an appointment as soon as possible for a visit in 2 weeks.   Specialty:  Orthopedic Surgery Contact information: [address] [place] [address] [phone]            Signed: Eulas Post 02/11/2018, 8:46 AM

## 2018-02-11 NOTE — Care Management Note (Signed)
Case Management Note  Patient Details  Name: Derek Lara MRN: [id number] Date of Birth: 11-20-1950  Subjective/Objective:  67 yr old gentleman s/p left unicompartmental knee arthroplasty.                  Action/Plan: Referral called to Shon Milletan Phillips, [facility] Liaison. Patient will have family support at discharge.    Expected Discharge Date:  02/11/18               Expected Discharge Plan:  Home w Home Health Services  In-House Referral:  NA  Discharge planning Services  CM Consult  Post Acute Care Choice:  Durable Medical Equipment, Home Health Choice offered to:  Patient  DME Arranged:  3-N-1, Walker rolling DME Agency:  [employer]  HH Arranged:  PT HH Agency:  [facility]  Status of Service:  Completed, signed off  If discussed at Long Length of Stay Meetings, dates discussed:    Additional Comments:  Durenda GuthrieBrady, Yojan Paskett Naomi, RN 02/11/2018, 3:09 PM

## 2018-02-11 NOTE — Progress Notes (Signed)
Physical Therapy Treatment Patient Details Name: Derek Lara MRN: [id number] DOB: 02/22/1951 Today's Date: 02/11/2018    History of Present Illness Pt is a 67 y.o. male s/p elective L unicompartmental knee replacement on 02/10/18. PMH includes DM, HTN.    PT Comments    Pt progressing with mobility. Ambulating increased distance with RW and able to ascend/descend steps with supervision for safety. Son-in-law present for education. Reviewed knee precautions. Will plan for additional stair training this afternoon prior to d/c home. Encouraged pt to continue ambulating with RW and supervision for family and/or nursing staff.    Follow Up Recommendations  Follow surgeon's recommendation for DC plan and follow-up therapies;Supervision for mobility/OOB     Equipment Recommendations  Rolling walker with 5" wheels;3in1 (PT)    Recommendations for Other Services       Precautions / Restrictions Precautions Precautions: Knee Precaution Booklet Issued: Yes (comment) Precaution Comments: reviewed knee precautions Restrictions Weight Bearing Restrictions: Yes LLE Weight Bearing: Weight bearing as tolerated    Mobility  Bed Mobility Overal bed mobility: Needs Assistance Bed Mobility: Supine to Sit     Supine to sit: Supervision;HOB elevated     General bed mobility comments: seated in recliner upon entry  Transfers Overall transfer level: Needs assistance Equipment used: Rolling walker (2 wheeled) Transfers: Sit to/from Stand Sit to Stand: Supervision         General transfer comment: Good technique and hand placement  Ambulation/Gait Ambulation/Gait assistance: Supervision Gait Distance (Feet): 200 Feet Assistive device: Rolling walker (2 wheeled) Gait Pattern/deviations: Step-through pattern;Decreased stride length;Decreased weight shift to left Gait velocity: Decreased  Gait velocity interpretation: <1.8 ft/sec, indicate of risk for recurrent falls General Gait  Details: Cues for increased step length and heel-to-toe gait pattern; good technique with RW. Supervision for safety   Stairs Stairs: Yes Stairs assistance: Supervision Stair Management: One rail Left;Step to pattern;Forwards Number of Stairs: 6 General stair comments: Ascend/descended 3 steps 2x with BUE support on L-side rail; son-in-law present for educaiton on technique. Supervision for safety   Wheelchair Mobility    Modified Rankin (Stroke Patients Only)       Balance Overall balance assessment: Needs assistance Sitting-balance support: No upper extremity supported;Feet supported Sitting balance-Leahy Scale: Good     Standing balance support: Bilateral upper extremity supported;During functional activity Standing balance-Leahy Scale: Poor Standing balance comment: Reliant on UE support                            Cognition Arousal/Alertness: Awake/alert Behavior During Therapy: WFL for tasks assessed/performed Overall Cognitive Status: Within Functional Limits for tasks assessed                                        Exercises      General Comments General comments (skin integrity, edema, etc.): Spouse and son-in-law present      Pertinent Vitals/Pain Pain Assessment: Faces Faces Pain Scale: Hurts a little bit Pain Location: L knee  Pain Descriptors / Indicators: Aching;Operative site guarding Pain Intervention(s): Monitored during session    Home Living Family/patient expects to be discharged to:: Private residence Living Arrangements: Spouse/significant other Available Help at Discharge: Family;Available 24 hours/day Type of Home: House Home Access: Stairs to enter Entrance Stairs-Rails: None Home Layout: Two level Home Equipment: None      Prior Function Level of Independence: Independent  PT Goals (current goals can now be found in the care plan section) Acute Rehab PT Goals Patient Stated Goal: to go  home PT Goal Formulation: With patient Time For Goal Achievement: 02/24/18 Potential to Achieve Goals: Good Progress towards PT goals: Progressing toward goals    Frequency    7X/week      PT Plan Current plan remains appropriate    Co-evaluation              AM-PAC PT "6 Clicks" Daily Activity  Outcome Measure  Difficulty turning over in bed (including adjusting bedclothes, sheets and blankets)?: A Little Difficulty moving from lying on back to sitting on the side of the bed? : A Little Difficulty sitting down on and standing up from a chair with arms (e.g., wheelchair, bedside commode, etc,.)?: A Little Help needed moving to and from a bed to chair (including a wheelchair)?: A Little Help needed walking in hospital room?: A Little Help needed climbing 3-5 steps with a railing? : A Little 6 Click Score: 18    End of Session Equipment Utilized During Treatment: Gait belt Activity Tolerance: Patient tolerated treatment well Patient left: in chair;with call bell/phone within reach;with family/visitor present Nurse Communication: Mobility status PT Visit Diagnosis: Other abnormalities of gait and mobility (R26.89);Pain Pain - Right/Left: Left Pain - part of body: Knee     Time: 1610-9604 PT Time Calculation (min) (ACUTE ONLY): 24 min  Charges:  $Gait Training: 23-37 mins                     Ina Homes, PT, DPT Acute Rehab Services  Pager: [phone]  Malachy Chamber 02/11/2018, 10:24 AM

## 2018-02-24 ENCOUNTER — Other Ambulatory Visit: Payer: Self-pay

## 2018-02-24 ENCOUNTER — Ambulatory Visit: Payer: Medicare Other | Attending: Orthopedic Surgery | Admitting: Physical Therapy

## 2018-02-24 ENCOUNTER — Encounter: Payer: Self-pay | Admitting: Physical Therapy

## 2018-02-24 DIAGNOSIS — R600 Localized edema: Secondary | ICD-10-CM | POA: Diagnosis present

## 2018-02-24 DIAGNOSIS — M25562 Pain in left knee: Secondary | ICD-10-CM | POA: Insufficient documentation

## 2018-02-24 DIAGNOSIS — M25662 Stiffness of left knee, not elsewhere classified: Secondary | ICD-10-CM | POA: Diagnosis present

## 2018-02-24 DIAGNOSIS — G8929 Other chronic pain: Secondary | ICD-10-CM | POA: Insufficient documentation

## 2018-02-24 DIAGNOSIS — R2689 Other abnormalities of gait and mobility: Secondary | ICD-10-CM | POA: Diagnosis present

## 2018-02-24 NOTE — Therapy (Signed)
[facility] [address] [place], [place], [address] Phone: [phone]   Fax:  [phone]  Physical Therapy Evaluation  Patient Details  Name: Derek Lara MRN: [id number] Date of Birth: 02/02/1951 Referring Provider: Teryl Lucy MD   Encounter Date: 02/24/2018  PT End of Session - 02/24/18 1303    Visit Number  1    Number of Visits  17    Date for PT Re-Evaluation  04/21/18    Authorization Type  MCR: kx mod by 15th visit, Progress note by 10th visit    PT Start Time  1315    PT Stop Time  1408    PT Time Calculation (min)  53 min    Activity Tolerance  Patient tolerated treatment well    Behavior During Therapy  [facility] for tasks assessed/performed       Past Medical History:  Diagnosis Date  . Diabetes mellitus without complication (HCC)   . Hypertension   . Primary localized osteoarthritis of left knee 02/10/2018    Past Surgical History:  Procedure Laterality Date  . CATARACT EXTRACTION W/ INTRAOCULAR LENS  IMPLANT, BILATERAL    . LEG SURGERY     vascular  . PARTIAL KNEE ARTHROPLASTY Left 02/10/2018   Procedure: LEFT UNICOMPARTMENTAL KNEE;  Surgeon: Teryl Lucy, MD;  Location: [facility] OR;  Service: Orthopedics;  Laterality: Left;    There were no vitals filed for this visit.   Subjective Assessment - 02/24/18 1302    Subjective  pt is a 67 y.o M s/p L unicompartmental arthroplasty on 02/10/2018. Since surgery the pain hasn't been bad and still use the walker all the time. pain stays in the front and inside of the knee. He denies any N/T in the knee. no hx of previous surgeries. some pain in the inside of the thigh that start a couple days ago. pt reports having only 1 visit of HHPT.     How long can you sit comfortably?  1-2 hour     How long can you stand comfortably?  30 min with RW    How long can you walk comfortably?  30-40 min     Diagnostic tests  x-ray at the surgery    Patient Stated Goals  bend  the knee, reduce pain     Currently in Pain?  Yes    Pain Score  3     Pain Location  Knee    Pain Orientation  Left;Anterior;Medial    Pain Descriptors / Indicators  Aching    Pain Type  Surgical pain    Pain Onset  More than a month ago    Pain Frequency  Intermittent    Aggravating Factors   lifting the leg, navigating the steps, bending the knee    Pain Relieving Factors  ice, medication     Effect of Pain on Daily Activities  limited stability and endurance with weight bearing.          [facility] PT Assessment - 02/24/18 1313      Assessment   Medical Diagnosis  02/25/2018    Referring Provider  Teryl Lucy MD    Onset Date/Surgical Date  02/10/18    Hand Dominance  Right    Prior Therapy  no      Precautions   Precautions  None      Restrictions   Weight Bearing Restrictions  No      Balance Screen   Has the patient fallen in the past  6 months  No      Home Nurse, mental health  Private residence    Living Arrangements  Spouse/significant other    Available Help at Discharge  Family;Available PRN/intermittently    Type of Home  House    Home Access  Level entry    Home Layout  Two level    Alternate Level Stairs-Number of Steps  16    Alternate Level Stairs-Rails  Left    Home Equipment  Walker - 2 wheels;Toilet riser      Prior Function   Level of Independence  Independent with basic ADLs    Vocation  Retired    Leisure  sit and watch TV      Cognition   Overall Cognitive Status  Within Functional Limits for tasks assessed      Observation/Other Assessments   Skin Integrity  incision appears clean, dry and healing well.     Focus on Therapeutic Outcomes (FOTO)   67% limited   predicted 42% limited     Observation/Other Assessments-Edema    Edema  Circumferential      Circumferential Edema   Circumferential - Right  @ joint line 42 cm     Circumferential - Left   44.1 at joint line,  40cm at 10cm below,, 51.5cm at 10cm above       Posture/Postural Control   Posture/Postural Control  Postural limitations    Postural Limitations  Rounded Shoulders;Forward head      ROM / Strength   AROM / PROM / Strength  AROM;PROM;Strength      AROM   AROM Assessment Site  Knee    Right/Left Knee  Right;Left    Right Knee Extension  123    Right Knee Flexion  0    Left Knee Extension  15    Left Knee Flexion  88      PROM   PROM Assessment Site  Knee    Right/Left Knee  Left    Left Knee Extension  12    Left Knee Flexion  109      Strength   Strength Assessment Site  Knee    Right/Left Knee  Right;Left    Right Knee Flexion  5/5    Right Knee Extension  5/5    Left Knee Flexion  4-/5   in available ROM   Left Knee Extension  4-/5   in available ROM     Palpation   Palpation comment  TTP noted along the medial joint line and along the incision      Ambulation/Gait   Ambulation/Gait  Yes    Assistive device  Rolling walker    Gait Pattern  Decreased stance time - left;Decreased step length - right   decreased knee flexion on L with limited heel strike/ toe of               Objective measurements completed on examination: See above findings.      [facility] Adult PT Treatment/Exercise - 02/24/18 1313      Exercises   Exercises  Knee/Hip      Knee/Hip Exercises: Stretches   Active Hamstring Stretch  Left;2 reps;30 seconds      Knee/Hip Exercises: Supine   Quad Sets  1 set;10 reps;5 reps;Strengthening;Left    Heel Slides  1 set;10 reps   with strap     Modalities   Modalities  Cryotherapy      Cryotherapy   Number Minutes Cryotherapy  10 Minutes    Cryotherapy Location  Knee    Type of Cryotherapy  Ice pack   on elevated bolster            PT Education - 02/24/18 1303    Education Details  evaluation findings, POC, goals, HEP with proper form/ rationale, anatomy of the area involved. how to properly ice with elevation     Person(s) Educated  Patient    Methods  Explanation;Verbal  cues;Handout    Comprehension  Verbalized understanding;Verbal cues required       PT Short Term Goals - 02/24/18 1304      PT SHORT TERM GOAL #1   Title  pt to be I with inital HEP     Time  4    Period  Weeks    Status  New    Target Date  03/24/18      PT SHORT TERM GOAL #2   Title  pt to verbalize/ demo proper techniques to reduce pain and inflammation via RICE and HEP     Time  4    Period  Weeks    Status  New    Target Date  03/24/18      PT SHORT TERM GOAL #3   Title  reduce L knee edema by >/= 2 cm overall to increase ROM and promote therapeutic progression    Time  4    Period  Weeks    Status  New    Target Date  03/24/18      PT SHORT TERM GOAL #4   Title  increase knee ROM to </= 10 to >/= 100 to promote ROM and therapeutic progression with </= 5/10 pain     Time  4    Period  Weeks    Status  New    Target Date  03/24/18        PT Long Term Goals - 02/24/18 1309      PT LONG TERM GOAL #1   Title  pt to increse L knee ROM </= 5 degrees to >/= 120 to promote functional and efficient gait pattern with </= 2/10 pain    Time  8    Period  Weeks    Status  New    Target Date  04/21/18      PT LONG TERM GOAL #2   Title  pt to increase LLE strength to >/= 4+/5 to promote knee stability with walking/ standing maximizing safety    Time  8    Period  Weeks    Status  New    Target Date  04/21/18      PT LONG TERM GOAL #3   Title  pt to be able to stand and walk >/= 45 min  and navigate up/ down >/= 16 steps reciprocally with </= 1 HHA for safety with </=1/10 pain for functional mobility     Time  8    Period  Weeks    Status  New    Target Date  04/21/18      PT LONG TERM GOAL #4   Title  increase FOTO score to </= 42% limited to demo improvement in function    Time  8    Period  Weeks    Status  New    Target Date  04/21/18      PT LONG TERM GOAL #5   Title  pt to be I with all HEP given as of last visit  to maintain and progress current level  of function independently    Time  8    Period  Weeks    Status  New    Target Date  04/21/18             Plan - 02/24/18 1403    Clinical Impression Statement  pt presents to OPPT s/p L unicompartmental arthroplasty on 02/10/2018. He demonstrates limited knee mobility secondary to pain and swelling, as well as limited strength. TTP noted along the medial joint line and along the incison. He demostrates an antalgic gait pattern utilizing a RW with limited stride and heel strike/ toe off on the L. He would benefit from physical therapy to decrease L knee pain, reduce pain and edema, increase strength and gait effenciency and maximize his function by addressing the deficits listed.     History and Personal Factors relevant to plan of care:  hx or diabetes and HTN, speaks english but has difficulty comprehending     Clinical Presentation  Evolving    Clinical Presentation due to:  limited knee ROM, strength, L knee pain, gait abnormality    Clinical Decision Making  Moderate    Rehab Potential  Good    PT Frequency  2x / week    PT Duration  8 weeks    PT Treatment/Interventions  ADLs/Self Care Home Management;Cryotherapy;Electrical Stimulation;Iontophoresis 4mg /ml Dexamethasone;Moist Heat;Ultrasound;Balance training;Neuromuscular re-education;Gait training;Stair training;Therapeutic activities;Therapeutic exercise;Manual techniques;Patient/family education;Taping;Vasopneumatic Device;Passive range of motion    PT Next Visit Plan  review/ update HEP, knee ROM and stretching, hip / knee strengthening, bike vs nu-step, vaso for swelling    PT Home Exercise Plan  quad set, hamstring stretching, SAQ, Heel slide with strap    Consulted and Agree with Plan of Care  Patient;Family member/caregiver    Family Member Consulted  wife       Patient will benefit from skilled therapeutic intervention in order to improve the following deficits and impairments:  Pain, Abnormal gait, Decreased strength,  Decreased knowledge of use of DME, Decreased activity tolerance, Decreased endurance, Increased fascial restricitons, Increased edema, Postural dysfunction, Improper body mechanics, Decreased knowledge of precautions, Increased muscle spasms, Decreased balance  Visit Diagnosis: Chronic pain of left knee  Localized edema  Other abnormalities of gait and mobility  Stiffness of left knee, not elsewhere classified     Problem List Patient Active Problem List   Diagnosis Date Noted  . Primary localized osteoarthritis of left knee 02/10/2018  . S/P left unicompartmental knee replacement 02/10/2018    Lulu Riding PT, DPT, LAT, ATC  02/24/18  2:11 PM      [facility] [address] [place], [place], [address] Phone: [phone]   Fax:  [phone]  Name: Derek Lara MRN: [id number] Date of Birth: [birth year]

## 2018-02-25 ENCOUNTER — Ambulatory Visit: Payer: Medicare Other | Admitting: Physical Therapy

## 2018-02-25 DIAGNOSIS — M25562 Pain in left knee: Principal | ICD-10-CM

## 2018-02-25 DIAGNOSIS — G8929 Other chronic pain: Secondary | ICD-10-CM

## 2018-02-25 NOTE — Therapy (Signed)
[facility] [address] [place], [place], [address] Phone: [phone]   Fax:  [phone]  Physical Therapy Treatment  Patient Details  Name: Derek Lara MRN: [id number] Date of Birth: [birth year] Referring Provider: Teryl Lucy MD   Encounter Date: 02/25/2018  PT End of Session - 02/25/18 1108    Visit Number  2    Number of Visits  17    Date for PT Re-Evaluation  04/21/18    Authorization Type  MCR: kx mod by 15th visit, Progress note by 10th visit    PT Start Time  1103    PT Stop Time  1156    PT Time Calculation (min)  53 min    Activity Tolerance  Patient tolerated treatment well;No increased pain    Behavior During Therapy  WFL for tasks assessed/performed       Past Medical History:  Diagnosis Date  . Diabetes mellitus without complication (HCC)   . Hypertension   . Primary localized osteoarthritis of left knee 02/10/2018    Past Surgical History:  Procedure Laterality Date  . CATARACT EXTRACTION W/ INTRAOCULAR LENS  IMPLANT, BILATERAL    . LEG SURGERY     vascular  . PARTIAL KNEE ARTHROPLASTY Left 02/10/2018   Procedure: LEFT UNICOMPARTMENTAL KNEE;  Surgeon: Teryl Lucy, MD;  [facility];  Service: Orthopedics;  Laterality: Left;    There were no vitals filed for this visit.  Subjective Assessment - 02/25/18 1108    Subjective  Pt reports he performed his exercises 2x yesterday.   Otherwise no new changes since yesterday.     Patient Stated Goals  bend the knee, reduce pain     Currently in Pain?  Yes    Pain Score  4     Pain Location  Knee    Pain Orientation  Left;Anterior;Medial    Pain Descriptors / Indicators  Aching         [facility] PT Assessment - 02/25/18 0001      Assessment   Medical Diagnosis  02/25/2018    Referring Provider  Teryl Lucy MD    Onset Date/Surgical Date  02/10/18    Hand Dominance  Right        [facility] Adult PT Treatment/Exercise - 02/25/18 0001       Exercises   Exercises  Knee/Hip      Knee/Hip Exercises: Stretches   Passive Hamstring Stretch  Left;30 seconds;3 reps   supine with strap.    Other Knee/Hip Stretches  butterfly stretch with Lt LE supported by bolster x 30 sec x 4 reps       Knee/Hip Exercises: Aerobic   Stationary Bike  partial revolutions for ROM in Lt knee x 7 min    PTA present to discuss progress and monitor     Knee/Hip Exercises: Standing   Gait Training  Step through pattern with RW, trunk forward flexed, Lt leg kept rigid and circumducted.  gait training for ~80 ft with demo and cues for increased Lt knee flexion during toe off and swing through, VC for upright posture, VC for even step length.        Knee/Hip Exercises: Seated   Long Arc Quad  Left;1 set;10 reps   5-10 sec holds in ext   Sit to [employer]  1 set;5 reps;with UE support   with RW. cues to bend knee, slow descent.      Knee/Hip Exercises: Supine   Quad Sets  Left;1 set;10 reps  Quad Sets Limitations  cues to count out loud to avoid holding breath    Heel Slides  1 set;10 reps   with strap   Heel Slides Limitations  5 reps with use of RLE.     Bridges  1 set;10 reps      Modalities   Modalities  Vasopneumatic      Vasopneumatic   Number Minutes Vasopneumatic   15 minutes    Vasopnuematic Location   Knee   Lt   Vasopneumatic Pressure  Low    Vasopneumatic Temperature   34 deg             PT Education - 02/25/18 1147    Education Details  added LAQ and sit to stand to [employer]) Educated  Patient    Methods  Explanation;Demonstration;Verbal cues;Tactile cues;Handout    Comprehension  Verbalized understanding;Returned demonstration       PT Short Term Goals - 02/24/18 1304      PT SHORT TERM GOAL #1   Title  pt to be I with inital HEP     Time  4    Period  Weeks    Status  New    Target Date  03/24/18      PT SHORT TERM GOAL #2   Title  pt to verbalize/ demo proper techniques to reduce pain and inflammation  via RICE and HEP     Time  4    Period  Weeks    Status  New    Target Date  03/24/18      PT SHORT TERM GOAL #3   Title  reduce L knee edema by >/= 2 cm overall to increase ROM and promote therapeutic progression    Time  4    Period  Weeks    Status  New    Target Date  03/24/18      PT SHORT TERM GOAL #4   Title  increase knee ROM to </= 10 to >/= 100 to promote ROM and therapeutic progression with </= 5/10 pain     Time  4    Period  Weeks    Status  New    Target Date  03/24/18        PT Long Term Goals - 02/24/18 1309      PT LONG TERM GOAL #1   Title  pt to increse L knee ROM </= 5 degrees to >/= 120 to promote functional and efficient gait pattern with </= 2/10 pain    Time  8    Period  Weeks    Status  New    Target Date  04/21/18      PT LONG TERM GOAL #2   Title  pt to increase LLE strength to >/= 4+/5 to promote knee stability with walking/ standing maximizing safety    Time  8    Period  Weeks    Status  New    Target Date  04/21/18      PT LONG TERM GOAL #3   Title  pt to be able to stand and walk >/= 45 min  and navigate up/ down >/= 16 steps reciprocally with </= 1 HHA for safety with </=1/10 pain for functional mobility     Time  8    Period  Weeks    Status  New    Target Date  04/21/18      PT LONG TERM GOAL #4  Title  increase FOTO score to </= 42% limited to demo improvement in function    Time  8    Period  Weeks    Status  New    Target Date  04/21/18      PT LONG TERM GOAL #5   Title  pt to be I with all HEP given as of last visit to maintain and progress current level of function independently    Time  8    Period  Weeks    Status  New    Target Date  04/21/18            Plan - 02/25/18 1143    Clinical Impression Statement  Pt initially ambulating with very stiff LLE; with cues and increased distance, pt's gait quality improved.  RW height adjusted to improve posture.  Pt reported decreased pain to 3/10 with exercise and  further reduction after use of vaso.  Pt progressing well towards goals.     Rehab Potential  Good    PT Frequency  2x / week    PT Duration  8 weeks    PT Treatment/Interventions  ADLs/Self Care Home Management;Cryotherapy;Electrical Stimulation;Iontophoresis 4mg /ml Dexamethasone;Moist Heat;Ultrasound;Balance training;Neuromuscular re-education;Gait training;Stair training;Therapeutic activities;Therapeutic exercise;Manual techniques;Patient/family education;Taping;Vasopneumatic Device;Passive range of motion    PT Next Visit Plan  continue progressive Lt knee ROM and functional strengthening.  modalities as indicated.        Patient will benefit from skilled therapeutic intervention in order to improve the following deficits and impairments:  Pain, Abnormal gait, Decreased strength, Decreased knowledge of use of DME, Decreased activity tolerance, Decreased endurance, Increased fascial restricitons, Increased edema, Postural dysfunction, Improper body mechanics, Decreased knowledge of precautions, Increased muscle spasms, Decreased balance  Visit Diagnosis: Chronic pain of left knee     Problem List Patient Active Problem List   Diagnosis Date Noted  . Primary localized osteoarthritis of left knee 02/10/2018  . S/P left unicompartmental knee replacement 02/10/2018   Mayer Camel, PTA 02/25/18 11:57 AM  [facility] [address] [place], [place], [address] Phone: [phone]   Fax:  [phone]  Name: Derek Lara MRN: [id number] Date of Birth: 12-01-1950

## 2018-02-25 NOTE — Patient Instructions (Signed)
[employer]    Straighten operated leg and try to hold it __5-10 seconds. X 10 reps   SIT TO STAND: No Device    Sit with feet shoulder-width apart, on floor. Lean chest forward, raise hips up from surface. Straighten hips and knees. Weight bear equally on left and right sides. _5-10_ reps per set, _2__ sets per day.

## 2018-03-03 ENCOUNTER — Encounter: Payer: Self-pay | Admitting: Physical Therapy

## 2018-03-03 ENCOUNTER — Ambulatory Visit: Payer: Medicare Other | Admitting: Physical Therapy

## 2018-03-03 DIAGNOSIS — R600 Localized edema: Secondary | ICD-10-CM

## 2018-03-03 DIAGNOSIS — R2689 Other abnormalities of gait and mobility: Secondary | ICD-10-CM

## 2018-03-03 DIAGNOSIS — G8929 Other chronic pain: Secondary | ICD-10-CM

## 2018-03-03 DIAGNOSIS — M25562 Pain in left knee: Principal | ICD-10-CM

## 2018-03-03 DIAGNOSIS — M25662 Stiffness of left knee, not elsewhere classified: Secondary | ICD-10-CM

## 2018-03-03 NOTE — Therapy (Signed)
[facility] [place], [place], [address] Phone: [phone]   Fax:  [phone]  Physical Therapy Treatment  Patient Details  Name: Derek Lara MRN: [id number] Date of Birth: 1950/07/14 Referring Provider: Marchia Bond MD   Encounter Date: 03/03/2018  PT End of Session - 03/03/18 1158    Visit Number  3    Number of Visits  17    Date for PT Re-Evaluation  04/21/18    Authorization Type  MCR: kx mod by 15th visit, Progress note by 10th visit    PT Start Time  1103    PT Stop Time  1200    PT Time Calculation (min)  57 min    Activity Tolerance  Patient tolerated treatment well    Behavior During Therapy  [facility] for tasks assessed/performed       Past Medical History:  Diagnosis Date  . Diabetes mellitus without complication ([place])   . Hypertension   . Primary localized osteoarthritis of left knee 02/10/2018    Past Surgical History:  Procedure Laterality Date  . CATARACT EXTRACTION W/ INTRAOCULAR LENS  IMPLANT, BILATERAL    . LEG SURGERY     vascular  . PARTIAL KNEE ARTHROPLASTY Left 02/10/2018   Procedure: LEFT UNICOMPARTMENTAL KNEE;  Surgeon: Marchia Bond, MD;  Location: [place];  Service: Orthopedics;  Laterality: Left;    There were no vitals filed for this visit.  Subjective Assessment - 03/03/18 1110    Subjective  I can walk 19 minutes with the walker.  Sometimes i walk with the cane.  I do the exercises.   PT is helping with the pain.  Sometimes I do the exercises 3 x a day.    Currently in Pain?  No/denies    Pain Score  0-No pain    Pain Location  Knee    Pain Orientation  Left    Aggravating Factors   bending    Pain Relieving Factors  ICE,   tylenol    Multiple Pain Sites  --   sometimes has back pain        [facility] PT Assessment - 03/03/18 0001      PROM   Left Knee Flexion  --   able to make full revolution on bike,  not formally measured                  [facility]  Adult PT Treatment/Exercise - 03/03/18 0001      Self-Care   Self-Care  RICE;Retrograde Massage      Knee/Hip Exercises: Stretches   Passive Hamstring Stretch  1 rep;20 seconds    Passive Hamstring Stretch Limitations  gentle to decrease pain    Quad Stretch  10 seconds   10 x   Quad Stretch Limitations  standing step stretch,  cues initially    Gastroc Stretch Limitations  2 x in incline, increased back pain so stretched at wall with no back pain      Knee/Hip Exercises: Aerobic   Recumbent Bike  AA at times.  Able to make full revolutions now   Needed hip position assist for correct stretch     Knee/Hip Exercises: Standing   Knee Flexion  10 reps;Both;AROM    Gait Training  cane,  cued with improvements as a result,  knee flexion and heel strike,  Decreased weigghtbearing noted as well as decreased anterior hip rotation noted on left.      Other Standing Knee Exercises  wall  slide facing wall 10 x each side cues initially      Knee/Hip Exercises: Seated   Long Arc Quad  10 reps;AROM      Knee/Hip Exercises: Supine   Quad Sets  10 reps    Heel Slides  10 reps;AAROM      Vasopneumatic   Number Minutes Vasopneumatic   15 minutes    Vasopnuematic Location   Knee    Vasopneumatic Pressure  Low    Vasopneumatic Temperature   lowest      Manual Therapy   Manual therapy comments  retrograde to left tihgh,  tissue softened,  PROM knee flexion and extension.             PT Education - 03/03/18 1156    Education Details  Gait training,  how to reduce edema    Person(s) Educated  Patient    Methods  Demonstration;Verbal cues    Comprehension  Verbalized understanding;Returned demonstration       PT Short Term Goals - 03/03/18 1201      PT SHORT TERM GOAL #1   Title  pt to be I with inital HEP     Baseline  independent with exercises so far    Time  4    Period  Weeks    Status  On-going      PT SHORT TERM GOAL #2   Title  pt to verbalize/ demo proper techniques  to reduce pain and inflammation via RICE and HEP     Baseline  Education continued today     Time  4    Period  Weeks    Status  Partially Met      PT SHORT TERM GOAL #3   Title  reduce L knee edema by >/= 2 cm overall to increase ROM and promote therapeutic progression    Baseline  not measured however addressing this with manual and vasopneumatic and education    Time  4    Period  Weeks    Status  On-going      PT SHORT TERM GOAL #4   Title  increase knee ROM to </= 10 to >/= 100 to promote ROM and therapeutic progression with </= 5/10 pain     Baseline  Not formally measiured able to make full revolution on recumbant    Time  4    Period  Weeks    Status  On-going        PT Long Term Goals - 02/24/18 1309      PT LONG TERM GOAL #1   Title  pt to increse L knee ROM </= 5 degrees to >/= 120 to promote functional and efficient gait pattern with </= 2/10 pain    Time  8    Period  Weeks    Status  New    Target Date  04/21/18      PT LONG TERM GOAL #2   Title  pt to increase LLE strength to >/= 4+/5 to promote knee stability with walking/ standing maximizing safety    Time  8    Period  Weeks    Status  New    Target Date  04/21/18      PT LONG TERM GOAL #3   Title  pt to be able to stand and walk >/= 45 min  and navigate up/ down >/= 16 steps reciprocally with </= 1 HHA for safety with </=1/10 pain for functional mobility     Time  8    Period  Weeks    Status  New    Target Date  04/21/18      PT LONG TERM GOAL #4   Title  increase FOTO score to </= 42% limited to demo improvement in function    Time  8    Period  Weeks    Status  New    Target Date  04/21/18      PT LONG TERM GOAL #5   Title  pt to be I with all HEP given as of last visit to maintain and progress current level of function independently    Time  8    Period  Weeks    Status  New    Target Date  04/21/18            Plan - 03/03/18 1159    Clinical Impression Statement  Gait  continues to improve.  He was able to progress gait to single point cane on level safely.  Pain 2/10 at end of session.  He was able to make a full revolution on recumbant bike today for the first time.   STG#2 partially met.    PT Next Visit Plan  continue progressive Lt knee ROM and functional strengthening.  modalities as indicated.     PT Home Exercise Plan  quad set, hamstring stretching, SAQ, Heel slide with strap,  sit to stand,  LAQ    Consulted and Agree with Plan of Care  Patient       Patient will benefit from skilled therapeutic intervention in order to improve the following deficits and impairments:     Visit Diagnosis: Chronic pain of left knee  Localized edema  Other abnormalities of gait and mobility  Stiffness of left knee, not elsewhere classified     Problem List Patient Active Problem List   Diagnosis Date Noted  . Primary localized osteoarthritis of left knee 02/10/2018  . S/P left unicompartmental knee replacement 02/10/2018    Shawneequa Baldridge PTA 03/03/2018, 12:15 PM  [facility] [address] [place], [place], [address] Phone: [phone]   Fax:  [phone]  Name: Laiden Milles MRN: [id number] Date of Birth: March 03, 1951

## 2018-03-05 ENCOUNTER — Ambulatory Visit: Payer: Medicare Other | Admitting: Physical Therapy

## 2018-03-05 ENCOUNTER — Encounter: Payer: Self-pay | Admitting: Physical Therapy

## 2018-03-05 DIAGNOSIS — R600 Localized edema: Secondary | ICD-10-CM

## 2018-03-05 DIAGNOSIS — G8929 Other chronic pain: Secondary | ICD-10-CM

## 2018-03-05 DIAGNOSIS — M25562 Pain in left knee: Secondary | ICD-10-CM | POA: Diagnosis not present

## 2018-03-05 DIAGNOSIS — M25662 Stiffness of left knee, not elsewhere classified: Secondary | ICD-10-CM

## 2018-03-05 DIAGNOSIS — R2689 Other abnormalities of gait and mobility: Secondary | ICD-10-CM

## 2018-03-05 NOTE — Therapy (Signed)
[facility] [address] [place], [place], [address] Phone: [phone]   Fax:  [phone]  Physical Therapy Treatment  Patient Details  Name: Derek Lara MRN: [id number] Date of Birth: 01/22/1951 Referring Provider: Joshua Landau MD   Encounter Date: 03/05/2018  PT End of Session - 03/05/18 0801    Visit Number  4    Number of Visits  17    Date for PT Re-Evaluation  04/21/18    Authorization Type  MCR: kx mod by 15th visit, Progress note by 10th visit    PT Start Time  0801    PT Stop Time  0844    PT Time Calculation (min)  43 min    Activity Tolerance  Patient tolerated treatment well    Behavior During Therapy  WFL for tasks assessed/performed       Past Medical History:  Diagnosis Date  . Diabetes mellitus without complication (HCC)   . Hypertension   . Primary localized osteoarthritis of left knee 02/10/2018    Past Surgical History:  Procedure Laterality Date  . CATARACT EXTRACTION W/ INTRAOCULAR LENS  IMPLANT, BILATERAL    . LEG SURGERY     vascular  . PARTIAL KNEE ARTHROPLASTY Left 02/10/2018   Procedure: LEFT UNICOMPARTMENTAL KNEE;  Surgeon: Landau, Joshua, MD;  Location: [facility];  Service: Orthopedics;  Laterality: Left;    There were no vitals filed for this visit.  Subjective Assessment - 03/05/18 0801    Subjective  "i am doing the exercises are helping pain is a 2/10 today"     Currently in Pain?  Yes    Pain Score  2     Pain Orientation  Left    Pain Descriptors / Indicators  Sore    Pain Type  Surgical pain    Pain Frequency  Intermittent         [facility] PT Assessment - 03/05/18 0806      AROM   Left Knee Extension  14    Left Knee Flexion  105                   [facility] Adult PT Treatment/Exercise - 03/05/18 0806      Knee/Hip Exercises: Stretches   Active Hamstring Stretch  2 reps;30 seconds    Quad Stretch  2 reps;30 seconds      Knee/Hip Exercises: Aerobic   Recumbent Bike  x 5 min full revolutions       Knee/Hip Exercises: Standing   Gait Training  proper heel strike/ toe off in // using RUE only to mimic use of SPC. heavy cues for proper heel strike/ toe off (pt is able to correct but constantly returns back to landing foot flat)      Knee/Hip Exercises: Seated   Long Arc Quad  2 sets;10 reps;Strengthening;Left    Hamstring Curl  2 sets;Strengthening;Left;10 reps    Hamstring Limitations  red theraband   cues for control     Knee/Hip Exercises: Supine   Heel Slides  Strengthening;AAROM;1 set;10 reps   holding end range x 5 sec ea,    Heel Slides Limitations  verbal cues to bending the knee using hamstring as far as possible then use UE with strap at end of available range to promtoe increased stretch    Bridges  1 set;10 reps;Strengthening   verbal cues to lift bottom off mat   Straight Leg Raises  1 set;10 reps   with sustained quad        Manual Therapy   Manual Therapy  Joint mobilization    Joint Mobilization  AP grade 3 in supine with active knee flexion between sets, seated PA grade 3 to promote extension              PT Education - 03/05/18 0844    Education Details  proper heel strike/ toe off to promote gait efficency    Person(s) Educated  Patient    Methods  Explanation;Verbal cues;Demonstration    Comprehension  Verbalized understanding;Verbal cues required;Returned demonstration;Tactile cues required;Need further instruction       PT Short Term Goals - 03/03/18 1201      PT SHORT TERM GOAL #1   Title  pt to be I with inital HEP     Baseline  independent with exercises so far    Time  4    Period  Weeks    Status  On-going      PT SHORT TERM GOAL #2   Title  pt to verbalize/ demo proper techniques to reduce pain and inflammation via RICE and HEP     Baseline  Education continued today     Time  4    Period  Weeks    Status  Partially Met      PT SHORT TERM GOAL #3   Title  reduce L knee edema by >/= 2  cm overall to increase ROM and promote therapeutic progression    Baseline  not measured however addressing this with manual and vasopneumatic and education    Time  4    Period  Weeks    Status  On-going      PT SHORT TERM GOAL #4   Title  increase knee ROM to </= 10 to >/= 100 to promote ROM and therapeutic progression with </= 5/10 pain     Baseline  Not formally measiured able to make full revolution on recumbant    Time  4    Period  Weeks    Status  On-going        PT Long Term Goals - 02/24/18 1309      PT LONG TERM GOAL #1   Title  pt to increse L knee ROM </= 5 degrees to >/= 120 to promote functional and efficient gait pattern with </= 2/10 pain    Time  8    Period  Weeks    Status  New    Target Date  04/21/18      PT LONG TERM GOAL #2   Title  pt to increase LLE strength to >/= 4+/5 to promote knee stability with walking/ standing maximizing safety    Time  8    Period  Weeks    Status  New    Target Date  04/21/18      PT LONG TERM GOAL #3   Title  pt to be able to stand and walk >/= 45 min  and navigate up/ down >/= 16 steps reciprocally with </= 1 HHA for safety with </=1/10 pain for functional mobility     Time  8    Period  Weeks    Status  New    Target Date  04/21/18      PT LONG TERM GOAL #4   Title  increase FOTO score to </= 42% limited to demo improvement in function    Time  8    Period  Weeks    Status  New    Target Date    04/21/18      PT LONG TERM GOAL #5   Title  pt to be I with all HEP given as of last visit to maintain and progress current level of function independently    Time  8    Period  Weeks    Status  New    Target Date  04/21/18            Plan - 03/05/18 0828    Clinical Impression Statement  pt reports 2/10 pain today. he is improving knee flexion to 105 degrees today but continues to demonstrate limited progression of extension compared to evaluation. continued mobs to promote ROM and quad/ hip strengthening.  practiced gait in // with heel strike/ toe off which he required heavy verbal cues and demonstration for proper form and using only RUE to mimic use of a SPC. end of session he reported no increase in pain.    PT Next Visit Plan  continue progressive Lt knee ROM and functional strengthening. gait training, modalities as indicated.     PT Home Exercise Plan  quad set, hamstring stretching, SAQ, Heel slide with strap,  sit to stand,  LAQ, heel toe walking       Patient will benefit from skilled therapeutic intervention in order to improve the following deficits and impairments:  Pain, Abnormal gait, Decreased strength, Decreased knowledge of use of DME, Decreased activity tolerance, Decreased endurance, Increased fascial restricitons, Increased edema, Postural dysfunction, Improper body mechanics, Decreased knowledge of precautions, Increased muscle spasms, Decreased balance  Visit Diagnosis: Chronic pain of left knee  Localized edema  Other abnormalities of gait and mobility  Stiffness of left knee, not elsewhere classified     Problem List Patient Active Problem List   Diagnosis Date Noted  . Primary localized osteoarthritis of left knee 02/10/2018  . S/P left unicompartmental knee replacement 02/10/2018   Starr Lake PT, DPT, LAT, ATC  03/05/18  8:48 AM      [facility] [address] [place], [place], [address] Phone: [phone]   Fax:  [phone]  Name: Alante Tolan MRN: [id number] Date of Birth: [birth year]

## 2018-03-10 ENCOUNTER — Encounter: Payer: Self-pay | Admitting: Physical Therapy

## 2018-03-10 ENCOUNTER — Ambulatory Visit: Payer: Medicare Other | Admitting: Physical Therapy

## 2018-03-10 DIAGNOSIS — R600 Localized edema: Secondary | ICD-10-CM

## 2018-03-10 DIAGNOSIS — M25562 Pain in left knee: Secondary | ICD-10-CM | POA: Diagnosis not present

## 2018-03-10 DIAGNOSIS — R2689 Other abnormalities of gait and mobility: Secondary | ICD-10-CM

## 2018-03-10 DIAGNOSIS — G8929 Other chronic pain: Secondary | ICD-10-CM

## 2018-03-10 DIAGNOSIS — M25662 Stiffness of left knee, not elsewhere classified: Secondary | ICD-10-CM

## 2018-03-10 NOTE — Therapy (Signed)
[place], [place], [address] Phone: [phone]   Fax:  [phone]  Physical Therapy Treatment  Patient Details  Name: Derek Lara MRN: [id number] Date of Birth: July 08, 1950 Referring Provider: Marchia Bond MD   Encounter Date: 03/10/2018  PT End of Session - 03/10/18 0843    Visit Number  5    Number of Visits  17    Date for PT Re-Evaluation  04/21/18    Authorization Type  MCR: kx mod by 15th visit, Progress note by 10th visit    PT Start Time  0845    PT Stop Time  0938    PT Time Calculation (min)  53 min    Activity Tolerance  Patient tolerated treatment well    Behavior During Therapy  [facility] for tasks assessed/performed       Past Medical History:  Diagnosis Date  . Diabetes mellitus without complication ([place])   . Hypertension   . Primary localized osteoarthritis of left knee 02/10/2018    Past Surgical History:  Procedure Laterality Date  . CATARACT EXTRACTION W/ INTRAOCULAR LENS  IMPLANT, BILATERAL    . LEG SURGERY     vascular  . PARTIAL KNEE ARTHROPLASTY Left 02/10/2018   Procedure: LEFT UNICOMPARTMENTAL KNEE;  Surgeon: Marchia Bond, MD;  Location: [place];  Service: Orthopedics;  Laterality: Left;    There were no vitals filed for this visit.  Subjective Assessment - 03/10/18 0843    Subjective  "I am feeling good today only a little pain at a 4/10, I've been working on the walking. I am still feeling numb on the outside of my knee"     Currently in Pain?  Yes    Pain Score  4     Pain Orientation  Left    Pain Descriptors / Indicators  Sore;Aching    Pain Type  Surgical pain    Pain Onset  More than a month ago    Pain Frequency  Intermittent    Aggravating Factors   bending, walking for too long, stairs    Pain Relieving Factors  ice, tylenol         [facility] PT Assessment - 03/10/18 0001      AROM   Left Knee Flexion  113   following manual                   [facility] Adult PT Treatment/Exercise - 03/10/18 0851      Exercises   Exercises  Knee/Hip      Knee/Hip Exercises: Stretches   Active Hamstring Stretch  2 reps;30 seconds   with strap   Active Hamstring Stretch Limitations  cues required to keep knee straight during stretch    Quad Stretch  30 seconds;3 reps   supine   Gastroc Stretch  Both;2 reps;30 seconds   on slant board     Knee/Hip Exercises: Aerobic   Recumbent Bike  L1 x 6 min min    lowering seat every 2 min to promote knee flexion     Knee/Hip Exercises: Machines for Strengthening   Cybex Leg Press  --      Knee/Hip Exercises: Standing   Forward Step Up  2 sets;10 reps;Step Height: 4";Hand Hold: 1    Step Down  2 sets;10 reps;Step Height: 4"    Step Down Limitations  verbal cues and demonstration for proper form    Wall Squat  2 sets;10 reps    Wall Squat  Limitations  weight shifting to the RLE, cues and demonstration to avoid weight shifting   multiple verbal cues to keep back flat against the wall   Gait Training  walking with SPC utilizing heel strike/ toe off pattern 4 x 50 ft       Knee/Hip Exercises: Seated   Hamstring Curl  2 sets;15 reps    Hamstring Limitations  red theraband      Vasopneumatic   Number Minutes Vasopneumatic   10 minutes    Vasopnuematic Location   Knee    Vasopneumatic Pressure  Medium    Vasopneumatic Temperature   34 deg      Manual Therapy   Manual Therapy  Passive ROM    Joint Mobilization  AP grade 3 in supine with active knee flexion between sets, seated PA grade 3 to promote extension     Passive ROM  forced flexion with PT's forearm in popliteal space with gradualing pressure pusing into flexion                PT Short Term Goals - 03/03/18 1201      PT SHORT TERM GOAL #1   Title  pt to be I with inital HEP     Baseline  independent with exercises so far    Time  4    Period  Weeks    Status  On-going      PT SHORT TERM GOAL #2    Title  pt to verbalize/ demo proper techniques to reduce pain and inflammation via RICE and HEP     Baseline  Education continued today     Time  4    Period  Weeks    Status  Partially Met      PT SHORT TERM GOAL #3   Title  reduce L knee edema by >/= 2 cm overall to increase ROM and promote therapeutic progression    Baseline  not measured however addressing this with manual and vasopneumatic and education    Time  4    Period  Weeks    Status  On-going      PT SHORT TERM GOAL #4   Title  increase knee ROM to </= 10 to >/= 100 to promote ROM and therapeutic progression with </= 5/10 pain     Baseline  Not formally measiured able to make full revolution on recumbant    Time  4    Period  Weeks    Status  On-going        PT Long Term Goals - 02/24/18 1309      PT LONG TERM GOAL #1   Title  pt to increse L knee ROM </= 5 degrees to >/= 120 to promote functional and efficient gait pattern with </= 2/10 pain    Time  8    Period  Weeks    Status  New    Target Date  04/21/18      PT LONG TERM GOAL #2   Title  pt to increase LLE strength to >/= 4+/5 to promote knee stability with walking/ standing maximizing safety    Time  8    Period  Weeks    Status  New    Target Date  04/21/18      PT LONG TERM GOAL #3   Title  pt to be able to stand and walk >/= 45 min  and navigate up/ down >/= 16 steps reciprocally with </= 1 HHA for  safety with </=1/10 pain for functional mobility     Time  8    Period  Weeks    Status  New    Target Date  04/21/18      PT LONG TERM GOAL #4   Title  increase FOTO score to </= 42% limited to demo improvement in function    Time  8    Period  Weeks    Status  New    Target Date  04/21/18      PT LONG TERM GOAL #5   Title  pt to be I with all HEP given as of last visit to maintain and progress current level of function independently    Time  8    Period  Weeks    Status  New    Target Date  04/21/18            Plan - 03/10/18 0931     Clinical Impression Statement  pt states he has 4/10 pain today. continued working on ROM which following manual he increased with active assistance to 113 degrees with soreness at end range. practiced gait training transition to [facility] with cues to avoid taking large steps to avoid loss of balance, but overall he did very well. strengthening went well but he does continue to require frequent verbal cues for proper form throughout the session. utilized vaso end of session for pain and swelling.     PT Treatment/Interventions  ADLs/Self Care Home Management;Cryotherapy;Electrical Stimulation;Iontophoresis 2m/ml Dexamethasone;Moist Heat;Ultrasound;Balance training;Neuromuscular re-education;Gait training;Stair training;Therapeutic activities;Therapeutic exercise;Manual techniques;Patient/family education;Taping;Vasopneumatic Device;Passive range of motion    PT Next Visit Plan  continue progressive Lt knee ROM and functional strengthening. gait training, modalities as indicated.     PT Home Exercise Plan  quad set, hamstring stretching, SAQ, Heel slide with strap,  sit to stand,  LAQ, heel toe walking    Consulted and Agree with Plan of Care  Patient    Family Member Consulted  wife       Patient will benefit from skilled therapeutic intervention in order to improve the following deficits and impairments:  Pain, Abnormal gait, Decreased strength, Decreased knowledge of use of DME, Decreased activity tolerance, Decreased endurance, Increased fascial restricitons, Increased edema, Postural dysfunction, Improper body mechanics, Decreased knowledge of precautions, Increased muscle spasms, Decreased balance  Visit Diagnosis: Chronic pain of left knee  Localized edema  Other abnormalities of gait and mobility  Stiffness of left knee, not elsewhere classified     Problem List Patient Active Problem List   Diagnosis Date Noted  . Primary localized osteoarthritis of left knee 02/10/2018  . S/P left  unicompartmental knee replacement 02/10/2018   KStarr LakePT, DPT, LAT, ATC  03/10/18  9:37 AM      [facility] [address]Phone: [phone]  Fax:  [phone] Name: PMarquay KruseMRN: [id number] of Birth: 608-18-1952

## 2018-03-12 ENCOUNTER — Ambulatory Visit: Payer: Medicare Other | Admitting: Physical Therapy

## 2018-03-12 ENCOUNTER — Encounter: Payer: Self-pay | Admitting: Physical Therapy

## 2018-03-12 DIAGNOSIS — M25562 Pain in left knee: Principal | ICD-10-CM

## 2018-03-12 DIAGNOSIS — R600 Localized edema: Secondary | ICD-10-CM

## 2018-03-12 DIAGNOSIS — R2689 Other abnormalities of gait and mobility: Secondary | ICD-10-CM

## 2018-03-12 DIAGNOSIS — M25662 Stiffness of left knee, not elsewhere classified: Secondary | ICD-10-CM

## 2018-03-12 DIAGNOSIS — G8929 Other chronic pain: Secondary | ICD-10-CM

## 2018-03-12 NOTE — Therapy (Signed)
[place], [place], [address] Phone: [phone]   Fax:  [phone]  Physical Therapy Treatment  Patient Details  Name: Derek Lara MRN: [id number] Date of Birth: 04-Jan-1951 Referring Provider (PT): Marchia Bond MD   Encounter Date: 03/12/2018  PT End of Session - 03/12/18 1413    Visit Number  6    Number of Visits  17    Date for PT Re-Evaluation  04/21/18    PT Start Time  1325    PT Stop Time  1422    PT Time Calculation (min)  57 min    Activity Tolerance  Patient tolerated treatment well    Behavior During Therapy  [facility] for tasks assessed/performed       Past Medical History:  Diagnosis Date  . Diabetes mellitus without complication ([place])   . Hypertension   . Primary localized osteoarthritis of left knee 02/10/2018    Past Surgical History:  Procedure Laterality Date  . CATARACT EXTRACTION W/ INTRAOCULAR LENS  IMPLANT, BILATERAL    . LEG SURGERY     vascular  . PARTIAL KNEE ARTHROPLASTY Left 02/10/2018   Procedure: LEFT UNICOMPARTMENTAL KNEE;  Surgeon: Marchia Bond, MD;  Location: [place];  Service: Orthopedics;  Laterality: Left;    There were no vitals filed for this visit.  Subjective Assessment - 03/12/18 1331    Subjective  My knee was sore a day after i rode the cycle last time. I have walked and worked the exercise.    Currently in Pain?  Yes    Pain Score  4     Pain Location  Knee    Pain Orientation  Left    Pain Descriptors / Indicators  Sore    Aggravating Factors   stairs ,  bendingm walking too long    Pain Relieving Factors  ice tylenol         [facility] PT Assessment - 03/12/18 0001      PROM   Left Knee Flexion  124                   [facility] Adult PT Treatment/Exercise - 03/12/18 0001      High Level Balance   High Level Balance Comments  single leg tasks,  kicking various places,  tandem walking static and dynamic      Knee/Hip Exercises:  Aerobic   Recumbent Bike  L1 x 6 min min      Knee/Hip Exercises: Standing   Forward Lunges  10 reps    Forward Lunges Limitations  small dips,  knee on sit fit braced on counter,  heavy cues    Forward Step Up  20 reps;Step Height: 4"    Step Down  20 reps;Step Height: 4"    Step Down Limitations  cued,  1 hand    SLS with Vectors  with toe taps forward side extension each  CGA    Other Standing Knee Exercises  wall slide facing wall single lef  10 X each  CGA      Knee/Hip Exercises: Seated   Hamstring Curl  10 reps;2 sets    Hamstring Limitations  greenn bands    Sit to Sand  10 reps      Vasopneumatic   Number Minutes Vasopneumatic   10 minutes    Vasopnuematic Location   Knee    Vasopneumatic Temperature   34      Manual Therapy   Manual Therapy  Passive ROM    Passive ROM  P/A glides with rolled towel and forearm in popleteal space with movement into flexion.               PT Education - 03/12/18 1413    Education Details  exercise form    Person(s) Educated  Patient    Methods  Explanation;Demonstration    Comprehension  Verbalized understanding;Returned demonstration       PT Short Term Goals - 03/12/18 1416      PT SHORT TERM GOAL #1   Title  pt to be I with inital HEP     Baseline  independent with exercises so far    Time  4    Period  Weeks    Status  On-going      PT SHORT TERM GOAL #2   Title  pt to verbalize/ demo proper techniques to reduce pain and inflammation via RICE and HEP     Baseline  understands,  uses    Time  4    Period  Weeks    Status  Achieved      PT SHORT TERM GOAL #3   Title  reduce L knee edema by >/= 2 cm overall to increase ROM and promote therapeutic progression    Baseline  addresssing with vaso,  elevation    Time  4    Period  Weeks    Status  On-going      PT SHORT TERM GOAL #4   Title  increase knee ROM to </= 10 to >/= 100 to promote ROM and therapeutic progression with </= 5/10 pain     Baseline  114  AROM     Time  4    Period  Weeks    Status  Partially Met        PT Long Term Goals - 02/24/18 1309      PT LONG TERM GOAL #1   Title  pt to increse L knee ROM </= 5 degrees to >/= 120 to promote functional and efficient gait pattern with </= 2/10 pain    Time  8    Period  Weeks    Status  New    Target Date  04/21/18      PT LONG TERM GOAL #2   Title  pt to increase LLE strength to >/= 4+/5 to promote knee stability with walking/ standing maximizing safety    Time  8    Period  Weeks    Status  New    Target Date  04/21/18      PT LONG TERM GOAL #3   Title  pt to be able to stand and walk >/= 45 min  and navigate up/ down >/= 16 steps reciprocally with </= 1 HHA for safety with </=1/10 pain for functional mobility     Time  8    Period  Weeks    Status  New    Target Date  04/21/18      PT LONG TERM GOAL #4   Title  increase FOTO score to </= 42% limited to demo improvement in function    Time  8    Period  Weeks    Status  New    Target Date  04/21/18      PT LONG TERM GOAL #5   Title  pt to be I with all HEP given as of last visit to maintain and progress current level of function independently  Time  8    Period  Weeks    Status  New    Target Date  04/21/18            Plan - 03/12/18 1414    Clinical Impression Statement  PROM 124 flexion.  Functional atrengthening continued.  Patient needs more work with balance.  STG#2 met.    PT Next Visit Plan  continue progressive Lt knee ROM and functional strengthening. gait training, modalities as indicated.   Balance    PT Home Exercise Plan  quad set, hamstring stretching, SAQ, Heel slide with strap,  sit to stand,  LAQ, heel toe walking    Consulted and Agree with Plan of Care  Patient       Patient will benefit from skilled therapeutic intervention in order to improve the following deficits and impairments:     Visit Diagnosis: Chronic pain of left knee  Localized edema  Other abnormalities of gait and  mobility  Stiffness of left knee, not elsewhere classified     Problem List Patient Active Problem List   Diagnosis Date Noted  . Primary localized osteoarthritis of left knee 02/10/2018  . S/P left unicompartmental knee replacement 02/10/2018    HARRIS,KAREN  PTA 03/12/2018, 3:11 PM  [facility] [address] [place], [place], [address] Phone: [phone]   Fax:  [phone]  Name: Klein Arroyo MRN: [id number] Date of Birth: 12/03/1950   

## 2018-03-16 ENCOUNTER — Encounter: Payer: Self-pay | Admitting: Physical Therapy

## 2018-03-16 ENCOUNTER — Ambulatory Visit: Payer: Medicare Other | Admitting: Physical Therapy

## 2018-03-16 DIAGNOSIS — M25662 Stiffness of left knee, not elsewhere classified: Secondary | ICD-10-CM

## 2018-03-16 DIAGNOSIS — M25562 Pain in left knee: Secondary | ICD-10-CM | POA: Diagnosis not present

## 2018-03-16 DIAGNOSIS — R2689 Other abnormalities of gait and mobility: Secondary | ICD-10-CM

## 2018-03-16 DIAGNOSIS — G8929 Other chronic pain: Secondary | ICD-10-CM

## 2018-03-16 DIAGNOSIS — R600 Localized edema: Secondary | ICD-10-CM

## 2018-03-16 NOTE — Therapy (Signed)
[place], [place], [address] Phone: [phone]   Fax:  [phone]  Physical Therapy Treatment  Patient Details  Name: Derek Lara MRN: [id number] Date of Birth: [birth year] Referring Provider (PT): Marchia Bond MD   Encounter Date: 03/16/2018  PT End of Session - 03/16/18 1328    Visit Number  7    Number of Visits  17    Date for PT Re-Evaluation  04/21/18    Authorization Type  MCR: kx mod by 15th visit, Progress note by 10th visit    PT Start Time  1330    PT Stop Time  1426    PT Time Calculation (min)  56 min    Activity Tolerance  Patient tolerated treatment well    Behavior During Therapy  [facility] for tasks assessed/performed       Past Medical History:  Diagnosis Date  . Diabetes mellitus without complication ([place])   . Hypertension   . Primary localized osteoarthritis of left knee 02/10/2018    Past Surgical History:  Procedure Laterality Date  . CATARACT EXTRACTION W/ INTRAOCULAR LENS  IMPLANT, BILATERAL    . LEG SURGERY     vascular  . PARTIAL KNEE ARTHROPLASTY Left 02/10/2018   Procedure: LEFT UNICOMPARTMENTAL KNEE;  Surgeon: Marchia Bond, MD;  Location: [place];  Service: Orthopedics;  Laterality: Left;    There were no vitals filed for this visit.  Subjective Assessment - 03/16/18 1336    Subjective  " I am still sore in the knee, today is abotu a 3/10"    Currently in Pain?  Yes    Pain Score  3     Pain Orientation  Left    Pain Descriptors / Indicators  Sore    Pain Type  Surgical pain         [facility] PT Assessment - 03/16/18 1349      AROM   Left Knee Flexion  114      PROM   Left Knee Flexion  120   with strap                  [facility] Adult PT Treatment/Exercise - 03/16/18 1337      Exercises   Exercises  Knee/Hip      Knee/Hip Exercises: Stretches   Active Hamstring Stretch  2 reps;30 seconds   with strap   Quad Stretch  2 reps;30 seconds     Gastroc Stretch  Both;2 reps;30 seconds      Knee/Hip Exercises: Aerobic   Recumbent Bike  L1 x 6 min min   lowering seat slightly every 2 min to improve flexion     Knee/Hip Exercises: Machines for Strengthening   Cybex Leg Press  1 x 10 20# bil LE, 1 x 10 40# bil LE      Knee/Hip Exercises: Seated   Long Arc Quad  2 sets   12   Long Arc Quad Weight  4 lbs.    Hamstring Curl  10 reps;2 sets    Hamstring Limitations  green band      Knee/Hip Exercises: Supine   Heel Slides  1 set;10 reps;Strengthening;AAROM   holding end range x 10 sec, was able to get to 120   Heel Slides Limitations  bending knee with hamstring as far able then using bil UE       Manual Therapy   Joint Mobilization  AP grade 3 in supine with active knee flexion between  sets, seated PA grade 3 to promote extension     Passive ROM  P/A glides with rolled towel and forearm in popleteal space with movement into flexion.            Balance Exercises - 03/16/18 1407      Balance Exercises: Standing   Standing Eyes Opened  2 reps;30 secs    Standing Eyes Closed  2 reps;30 secs    Tandem Stance  Eyes open;4 reps;30 secs          PT Short Term Goals - 03/12/18 1416      PT SHORT TERM GOAL #1   Title  pt to be I with inital HEP     Baseline  independent with exercises so far    Time  4    Period  Weeks    Status  On-going      PT SHORT TERM GOAL #2   Title  pt to verbalize/ demo proper techniques to reduce pain and inflammation via RICE and HEP     Baseline  understands,  uses    Time  4    Period  Weeks    Status  Achieved      PT SHORT TERM GOAL #3   Title  reduce L knee edema by >/= 2 cm overall to increase ROM and promote therapeutic progression    Baseline  addresssing with vaso,  elevation    Time  4    Period  Weeks    Status  On-going      PT SHORT TERM GOAL #4   Title  increase knee ROM to </= 10 to >/= 100 to promote ROM and therapeutic progression with </= 5/10 pain      Baseline  114  AROM    Time  4    Period  Weeks    Status  Partially Met        PT Long Term Goals - 02/24/18 1309      PT LONG TERM GOAL #1   Title  pt to increse L knee ROM </= 5 degrees to >/= 120 to promote functional and efficient gait pattern with </= 2/10 pain    Time  8    Period  Weeks    Status  New    Target Date  04/21/18      PT LONG TERM GOAL #2   Title  pt to increase LLE strength to >/= 4+/5 to promote knee stability with walking/ standing maximizing safety    Time  8    Period  Weeks    Status  New    Target Date  04/21/18      PT LONG TERM GOAL #3   Title  pt to be able to stand and walk >/= 45 min  and navigate up/ down >/= 16 steps reciprocally with </= 1 HHA for safety with </=1/10 pain for functional mobility     Time  8    Period  Weeks    Status  New    Target Date  04/21/18      PT LONG TERM GOAL #4   Title  increase FOTO score to </= 42% limited to demo improvement in function    Time  8    Period  Weeks    Status  New    Target Date  04/21/18      PT LONG TERM GOAL #5   Title  pt to be I with all HEP  given as of last visit to maintain and progress current level of function independently    Time  8    Period  Weeks    Status  New    Target Date  04/21/18            Plan - 03/16/18 1416    Clinical Impression Statement  pt reports 3/10 pain today. continued working on ROM which he was able to get with AAROM 120 degrees. continued progression of strengthening. began balance training today which he perofrmed well but required frequent cues for confidence. continued vaso end of session for pain/ swelling.     PT Treatment/Interventions  ADLs/Self Care Home Management;Cryotherapy;Electrical Stimulation;Iontophoresis 61m/ml Dexamethasone;Moist Heat;Ultrasound;Balance training;Neuromuscular re-education;Gait training;Stair training;Therapeutic activities;Therapeutic exercise;Manual techniques;Patient/family education;Taping;Vasopneumatic  Device;Passive range of motion    PT Next Visit Plan  continue progressive Lt knee ROM and functional strengthening. gait training, modalities as indicated.   Balance    PT Home Exercise Plan  quad set, hamstring stretching, SAQ, Heel slide with strap,  sit to stand,  LAQ, heel toe walking    Consulted and Agree with Plan of Care  Patient    Family Member Consulted  wife       Patient will benefit from skilled therapeutic intervention in order to improve the following deficits and impairments:  Pain, Abnormal gait, Decreased strength, Decreased knowledge of use of DME, Decreased activity tolerance, Decreased endurance, Increased fascial restricitons, Increased edema, Postural dysfunction, Improper body mechanics, Decreased knowledge of precautions, Increased muscle spasms, Decreased balance  Visit Diagnosis: Chronic pain of left knee  Localized edema  Other abnormalities of gait and mobility  Stiffness of left knee, not elsewhere classified     Problem List Patient Active Problem List   Diagnosis Date Noted  . Primary localized osteoarthritis of left knee 02/10/2018  . S/P left unicompartmental knee replacement 02/10/2018   KStarr LakePT, DPT, LAT, ATC  03/16/18  2:19 PM       [facility] [address]Phone: [phone]  Fax:  [phone] Name: Derek HickoxMRN: [id number] of Birth: 609-14-1952

## 2018-03-19 ENCOUNTER — Ambulatory Visit: Payer: Medicare Other | Attending: Orthopedic Surgery | Admitting: Physical Therapy

## 2018-03-19 ENCOUNTER — Encounter: Payer: Self-pay | Admitting: Physical Therapy

## 2018-03-19 DIAGNOSIS — R2689 Other abnormalities of gait and mobility: Secondary | ICD-10-CM | POA: Diagnosis present

## 2018-03-19 DIAGNOSIS — G8929 Other chronic pain: Secondary | ICD-10-CM | POA: Insufficient documentation

## 2018-03-19 DIAGNOSIS — R600 Localized edema: Secondary | ICD-10-CM | POA: Diagnosis present

## 2018-03-19 DIAGNOSIS — M25562 Pain in left knee: Secondary | ICD-10-CM | POA: Diagnosis present

## 2018-03-19 DIAGNOSIS — M25662 Stiffness of left knee, not elsewhere classified: Secondary | ICD-10-CM | POA: Diagnosis present

## 2018-03-19 NOTE — Therapy (Signed)
[place], [place], [address] Phone: [phone]   Fax:  [phone]  Physical Therapy Treatment  Patient Details  Name: Derek Lara MRN: [id number] Date of Birth: 27-Feb-1951 Referring Provider (PT): Marchia Bond MD   Encounter Date: 03/19/2018  PT End of Session - 03/19/18 1825    Visit Number  8    Number of Visits  17    Date for PT Re-Evaluation  04/21/18    PT Start Time  6803    PT Stop Time  1512    PT Time Calculation (min)  54 min    Activity Tolerance  Patient tolerated treatment well    Behavior During Therapy  [facility] for tasks assessed/performed       Past Medical History:  Diagnosis Date  . Diabetes mellitus without complication ([place])   . Hypertension   . Primary localized osteoarthritis of left knee 02/10/2018    Past Surgical History:  Procedure Laterality Date  . CATARACT EXTRACTION W/ INTRAOCULAR LENS  IMPLANT, BILATERAL    . LEG SURGERY     vascular  . PARTIAL KNEE ARTHROPLASTY Left 02/10/2018   Procedure: LEFT UNICOMPARTMENTAL KNEE;  Surgeon: Marchia Bond, MD;  Location: [place];  Service: Orthopedics;  Laterality: Left;    There were no vitals filed for this visit.  Subjective Assessment - 03/19/18 1421    Subjective  Sore wit walking distal quads with longer walking. I am using less pain meds.    Patient is accompained by:  Family member   Wife in lobby   Currently in Pain?  Yes    Pain Score  3     Pain Location  Knee    Pain Orientation  Left    Pain Descriptors / Indicators  Sore    Aggravating Factors   walking longer    Pain Relieving Factors  ice,     Effect of Pain on Daily Activities  limites stability and endurance with weightbearing.                        [facility] Adult PT Treatment/Exercise - 03/19/18 0001      Knee/Hip Exercises: Stretches   Gastroc Stretch  Both;2 reps;3 reps    Gastroc Stretch Limitations  incline board,  moderate  cues initially      Knee/Hip Exercises: Aerobic   Recumbent Bike  L1 about 6 minutes   needed cues to adduct his hip.     Knee/Hip Exercises: Machines for Strengthening   Cybex Knee Extension  5 LBS up both lower left X 5 Painful.  Min assist for neutral hip   pain ful so stopped   Cybex Knee Flexion  15 .20 LBs 10 X 2 sets    Cybex Leg Press  1 plate both X 20,  single 10 X 1 AA  difficult today      Knee/Hip Exercises: Standing   Forward Step Up  20 reps;Step Height: 6"    Forward Step Up Limitations  in door way,  1 hand    Walking with Sports Cord  cable walk 10 x forward/ reverse with 10  LBS pull.  SBA for safety.    Other Standing Knee Exercises  10X each      Cryotherapy   Number Minutes Cryotherapy  10 Minutes    Cryotherapy Location  Knee    Type of Cryotherapy  --   cold pack     Manual  Therapy   Manual therapy comments  instrument assist soft tissue work lateral quads,  distal,  tissue softened              PT Education - 03/19/18 1825    Education Details  exercise form    Person(s) Educated  Patient    Methods  Explanation;Demonstration;Tactile cues;Verbal cues    Comprehension  Returned demonstration;Verbalized understanding       PT Short Term Goals - 03/19/18 1830      PT SHORT TERM GOAL #1   Title  pt to be I with inital HEP     Baseline  says he is independent with initial HEP    Time  4    Period  Weeks    Status  Achieved      PT SHORT TERM GOAL #2   Title  pt to verbalize/ demo proper techniques to reduce pain and inflammation via RICE and HEP     Time  4    Period  Weeks    Status  Achieved      PT SHORT TERM GOAL #3   Title  reduce L knee edema by >/= 2 cm overall to increase ROM and promote therapeutic progression    Time  4    Period  Weeks    Status  Unable to assess      PT SHORT TERM GOAL #4   Title  increase knee ROM to </= 10 to >/= 100 to promote ROM and therapeutic progression with </= 5/10 pain     Time  4    Period   Weeks    Status  Unable to assess        PT Long Term Goals - 02/24/18 1309      PT LONG TERM GOAL #1   Title  pt to increse L knee ROM </= 5 degrees to >/= 120 to promote functional and efficient gait pattern with </= 2/10 pain    Time  8    Period  Weeks    Status  New    Target Date  04/21/18      PT LONG TERM GOAL #2   Title  pt to increase LLE strength to >/= 4+/5 to promote knee stability with walking/ standing maximizing safety    Time  8    Period  Weeks    Status  New    Target Date  04/21/18      PT LONG TERM GOAL #3   Title  pt to be able to stand and walk >/= 45 min  and navigate up/ down >/= 16 steps reciprocally with </= 1 HHA for safety with </=1/10 pain for functional mobility     Time  8    Period  Weeks    Status  New    Target Date  04/21/18      PT LONG TERM GOAL #4   Title  increase FOTO score to </= 42% limited to demo improvement in function    Time  8    Period  Weeks    Status  New    Target Date  04/21/18      PT LONG TERM GOAL #5   Title  pt to be I with all HEP given as of last visit to maintain and progress current level of function independently    Time  8    Period  Weeks    Status  New    Target Date  04/21/18            Plan - 03/19/18 1826    Clinical Impression Statement  Session focus on strengthening today.  STG#1 met. He wants to be able to progress to a gym in the future so a few machines were tried today.  Patient had some soreness with LAQ qith 5 LB weight ( lift both legs,  Hold left 5 seconds the n lower)  pain addressed with manual and ice.  Patient continues with antalgic gait.      PT Next Visit Plan  Gait training.   FOTO soon  consider using a mirror. continue progressive Lt knee ROM and functional strengthening.modalities as indicated.   Balance    PT Home Exercise Plan  quad set, hamstring stretching, SAQ, Heel slide with strap,  sit to stand,  LAQ, heel toe walking    Consulted and Agree with Plan of Care   Patient       Patient will benefit from skilled therapeutic intervention in order to improve the following deficits and impairments:     Visit Diagnosis: Chronic pain of left knee  Localized edema  Other abnormalities of gait and mobility  Stiffness of left knee, not elsewhere classified     Problem List Patient Active Problem List   Diagnosis Date Noted  . Primary localized osteoarthritis of left knee 02/10/2018  . S/P left unicompartmental knee replacement 02/10/2018    Kaegan Stigler PTA 03/19/2018, 6:32 PM  [place], [place], [address] Phone: [phone]   Fax:  [phone]  Name: Derek Lara MRN: [id number] Date of Birth: Apr 30, 1951

## 2018-03-23 ENCOUNTER — Ambulatory Visit: Payer: Medicare Other | Admitting: Physical Therapy

## 2018-03-23 DIAGNOSIS — G8929 Other chronic pain: Secondary | ICD-10-CM

## 2018-03-23 DIAGNOSIS — M25562 Pain in left knee: Principal | ICD-10-CM

## 2018-03-23 DIAGNOSIS — R2689 Other abnormalities of gait and mobility: Secondary | ICD-10-CM

## 2018-03-23 DIAGNOSIS — M25662 Stiffness of left knee, not elsewhere classified: Secondary | ICD-10-CM

## 2018-03-23 DIAGNOSIS — R600 Localized edema: Secondary | ICD-10-CM

## 2018-03-23 NOTE — Therapy (Signed)
[facility] [address] [place], [place], [address] Phone: [phone]   Fax:  [phone]  Physical Therapy Treatment  Patient Details  Name: Derek Lara MRN: [id number] Date of Birth: 1951/04/01 Referring Provider (PT): Teryl Lucy MD   Encounter Date: 03/23/2018  PT End of Session - 03/23/18 1819    Visit Number  9    Number of Visits  17    Date for PT Re-Evaluation  04/21/18    PT Start Time  1333    PT Stop Time  1430    PT Time Calculation (min)  57 min    Activity Tolerance  Patient tolerated treatment well    Behavior During Therapy  [facility] for tasks assessed/performed       Past Medical History:  Diagnosis Date  . Diabetes mellitus without complication ([facility])   . Hypertension   . Primary localized osteoarthritis of left knee 02/10/2018    Past Surgical History:  Procedure Laterality Date  . CATARACT EXTRACTION W/ INTRAOCULAR LENS  IMPLANT, BILATERAL    . LEG SURGERY     vascular  . PARTIAL KNEE ARTHROPLASTY Left 02/10/2018   Procedure: LEFT UNICOMPARTMENTAL KNEE;  Surgeon: Teryl Lucy, MD;  Location[facility];  Service: Orthopedics;  Laterality: Left;    There were no vitals filed for this visit.  Subjective Assessment - 03/23/18 1335    Subjective  Distal lateral quads sore  2/10.  On bike pain gone with min assist to decrease hip ER to work o true flexion ( vs twisting)    Currently in Pain?  Yes    Pain Score  2     Pain Location  Knee    Pain Orientation  Left;Lateral    Pain Descriptors / Indicators  Sore    Aggravating Factors   walking,  recumbant with hip ER    Pain Relieving Factors  ice,  bike with neutral hip    Effect of Pain on Daily Activities  uses cane in community                       [facility] Adult PT Treatment/Exercise - 03/23/18 0001      Ambulation/Gait   Gait Comments  Education on level  cued trunk rotation to improve weight shift.  Gait iimproved.  he  walks 15 minutes      Neuro Re-ed    Neuro Re-ed Details   at counter comlpiant, noncompliant, static/dynamic, narrow and normal base of support mod cues,  CGA 50% of time for safety.        Knee/Hip Exercises: Stretches   Passive Hamstring Stretch  3 reps;30 seconds   and 2 x 15 seconds     Knee/Hip Exercises: Aerobic   Recumbent Bike  L1 about 5 minutes,  min assist initially to keep hip adducted to decrease latetral quad pain/soreness.       Knee/Hip Exercises: Machines for Strengthening   Cybex Leg Press  1 Plate 10 x 3 , both      Knee/Hip Exercises: Standing   Heel Raises  10 reps    Heel Raises Limitations  also tip toe walking      Knee/Hip Exercises: Seated   Long Arc Quad  10 reps    Long Arc Quad Weight  0 lbs.    Long [employer] Limitations  with ball squeeze      Knee/Hip Exercises: Supine   Quad Sets  10 reps  Quad Sets Limitations  poor contraction with max cues able to do right fine    Short Arc [employer] Limitations  6,11,11 LBS with ball squeeze    Heel Slides  5 reps    Heel Slides Limitations  strap      Cryotherapy   Number Minutes Cryotherapy  10 Minutes    Cryotherapy Location  Knee    Type of Cryotherapy  --   cold pack            PT Education - 03/23/18 1818    Education Details  gait training,  exercise form    Person(s) Educated  Patient    Methods  Explanation;Demonstration;Tactile cues;Verbal cues    Comprehension  Verbalized understanding;Returned demonstration       PT Short Term Goals - 03/19/18 1830      PT SHORT TERM GOAL #1   Title  pt to be I with inital HEP     Baseline  says he is independent with initial HEP    Time  4    Period  Weeks    Status  Achieved      PT SHORT TERM GOAL #2   Title  pt to verbalize/ demo proper techniques to reduce pain and inflammation via RICE and HEP     Time  4    Period  Weeks    Status  Achieved      PT SHORT TERM GOAL #3   Title  reduce L knee edema by >/= 2 cm overall to  increase ROM and promote therapeutic progression    Time  4    Period  Weeks    Status  Unable to assess      PT SHORT TERM GOAL #4   Title  increase knee ROM to </= 10 to >/= 100 to promote ROM and therapeutic progression with </= 5/10 pain     Time  4    Period  Weeks    Status  Unable to assess        PT Long Term Goals - 02/24/18 1309      PT LONG TERM GOAL #1   Title  pt to increse L knee ROM </= 5 degrees to >/= 120 to promote functional and efficient gait pattern with </= 2/10 pain    Time  8    Period  Weeks    Status  New    Target Date  04/21/18      PT LONG TERM GOAL #2   Title  pt to increase LLE strength to >/= 4+/5 to promote knee stability with walking/ standing maximizing safety    Time  8    Period  Weeks    Status  New    Target Date  04/21/18      PT LONG TERM GOAL #3   Title  pt to be able to stand and walk >/= 45 min  and navigate up/ down >/= 16 steps reciprocally with </= 1 HHA for safety with </=1/10 pain for functional mobility     Time  8    Period  Weeks    Status  New    Target Date  04/21/18      PT LONG TERM GOAL #4   Title  increase FOTO score to </= 42% limited to demo improvement in function    Time  8    Period  Weeks    Status  New    Target Date  04/21/18  PT LONG TERM GOAL #5   Title  pt to be I with all HEP given as of last visit to maintain and progress current level of function independently    Time  8    Period  Weeks    Status  New    Target Date  04/21/18            Plan - 03/23/18 1819    Clinical Impression Statement  Patient was able to improve gait with no limp post session.  Trunk rotation hepled get left hip more anterior.  he is walking in neighborhood with walker at times due to his balance concerns.  Balance was addressed with exercise today.  Distal knee lateral quad soreness noted at beginning of session was resolved with exercise.   Remember FOTO and 10 visit progress note next visit.    PT Next  Visit Plan  Gait training.   FOTO  build HEP,  consider step ups or balance.. continue progressive Lt knee ROM and functional strengthening.modalities as indicated.   Balance    PT Home Exercise Plan  quad set, hamstring stretching, SAQ, Heel slide with strap,  sit to stand,  LAQ, heel toe walking    Consulted and Agree with Plan of Care  Patient       Patient will benefit from skilled therapeutic intervention in order to improve the following deficits and impairments:     Visit Diagnosis: Chronic pain of left knee  Localized edema  Other abnormalities of gait and mobility  Stiffness of left knee, not elsewhere classified     Problem List Patient Active Problem List   Diagnosis Date Noted  . Primary localized osteoarthritis of left knee 02/10/2018  . S/P left unicompartmental knee replacement 02/10/2018    Marzella Miracle  PTA 03/23/2018, 6:24 PM  [facility] [address] [place], [place], [address] Phone: [phone]   Fax:  [phone]  Name: Rohaan Durnil MRN: [id number] Date of Birth: [birth year]

## 2018-03-26 ENCOUNTER — Ambulatory Visit: Payer: Medicare Other | Admitting: Physical Therapy

## 2018-03-26 ENCOUNTER — Encounter: Payer: Self-pay | Admitting: Physical Therapy

## 2018-03-26 DIAGNOSIS — R2689 Other abnormalities of gait and mobility: Secondary | ICD-10-CM

## 2018-03-26 DIAGNOSIS — M25562 Pain in left knee: Secondary | ICD-10-CM | POA: Diagnosis not present

## 2018-03-26 DIAGNOSIS — R600 Localized edema: Secondary | ICD-10-CM

## 2018-03-26 DIAGNOSIS — G8929 Other chronic pain: Secondary | ICD-10-CM

## 2018-03-26 DIAGNOSIS — M25662 Stiffness of left knee, not elsewhere classified: Secondary | ICD-10-CM

## 2018-03-26 NOTE — Patient Instructions (Addendum)
WALKING  Walking is a great form of exercise to increase your strength, endurance and overall fitness.  A walking program can help you start slowly and gradually build endurance as you go.  Everyone's ability is different, so each person's starting point will be different.  You do not have to follow them exactly.  The are just samples. You should simply find out what's right for you and stick to that program.   In the beginning, you'll start off walking 2-3 times a day for short distances.  As you get stronger, you'll be walking further at just 1-2 times per day.  A. You Can Walk For A Certain Length Of Time Each Day    Walk 5 minutes 3 times per day.  Increase 2 minutes every 2 days (3 times per day).  Work up to 25-30 minutes (1-2 times per day).   Example:   Day 1-2 5 minutes 3 times per day   Day 7-8 12 minutes 2-3 times per day   Day 13-14 25 minutes 1-2 times per day  B. You Can Walk For a Certain Distance Each Day     Distance can be substituted for time.    Example:   3 trips to mailbox (at road)   3 trips to corner of block   3 trips around the block  C. Go to local [employer] and use the track.    Walk for distance ____ around track  Or time ____ minutes  D. Walk __20__    Please only do the exercises that your therapist has initialed and dated Proprioception, Coordination, Quad Strength: Retro Step-Up    Step on backwards with one foot, then the other. Step off forward the same way. Use _4-6___ inch step. Repeat __10-20__ times. Do __1__ sessions per day. 3 to 4 x a week http://cc.exer.us/5   Copyright  VHI. All rights reserved.  Proprioception, Quad Strength, Coordination: Side Step-Up    Step up sideways with one foot, then the other. Step off other side the same way. Use __4-6__ inch step. Repeat _10-20___ times. Do _1___ sessions per day. 3-4 x a week.  http://cc.exer.us/6   Copyright  VHI. All rights reserved.  Proprioception, Quad Strength,  Timing, Coordination: Forward Step-Up    Move onto step with one foot, then the other. Step back off the same way. Use __6__ inch step. Repeat 10 to 20 ____ times. Do __1__ sessions per day. 3 to 4 x a week. http://cc.exer.us/4   Copyright  VHI. All rights reserved.

## 2018-03-26 NOTE — Therapy (Addendum)
[place], [place], [address] Phone: [phone]   Fax:  [phone]  Physical Therapy Treatment  Progress Note Reporting Period 02/24/2018  to 03/26/2018  See note below for Objective Data and Assessment of Progress/Goals.      Patient Details  Name: Derek Lara MRN: [id number] Date of Birth: [birth year] Referring Provider (PT): Marchia Bond MD   Encounter Date: 03/26/2018  PT End of Session - 03/26/18 1512    Visit Number  10    Number of Visits  17    Date for PT Re-Evaluation  04/21/18    Authorization Type  MCR: kx mod by 15th visit, Progress note by 10th visit    PT Start Time  1410    PT Stop Time  1509    PT Time Calculation (min)  59 min    Activity Tolerance  Patient tolerated treatment well    Behavior During Therapy  [facility] for tasks assessed/performed       Past Medical History:  Diagnosis Date  . Diabetes mellitus without complication ([place])   . Hypertension   . Primary localized osteoarthritis of left knee 02/10/2018    Past Surgical History:  Procedure Laterality Date  . CATARACT EXTRACTION W/ INTRAOCULAR LENS  IMPLANT, BILATERAL    . LEG SURGERY     vascular  . PARTIAL KNEE ARTHROPLASTY Left 02/10/2018   Procedure: LEFT UNICOMPARTMENTAL KNEE;  Surgeon: Marchia Bond, MD;  Location: [place];  Service: Orthopedics;  Laterality: Left;    There were no vitals filed for this visit.  Subjective Assessment - 03/26/18 1410    Subjective  Distal quads sore 1.5 to 2/10.  I have 2 more visits.  i want exercises I need to do at home.      Patient is accompained by:  Family member   Wife in [place]   Currently in Pain?  Yes    Pain Score  2     Pain Location  Knee    Pain Orientation  Lateral;Left;Distal    Pain Descriptors / Indicators  Sore    Pain Type  Surgical pain    Aggravating Factors   squeezing    Pain Relieving Factors  ice helps         [facility] PT Assessment -  03/26/18 0001      PROM   Left Knee Flexion  121      Strength   Left Knee Flexion  5/5    Left Knee Extension  5/5                   [facility] Adult PT Treatment/Exercise - 03/26/18 0001      High Level Balance   High Level Balance Comments  At counter.  walking forward reverse,  side steps each way,  high march  all SBA.  SLS X 5 2-3 seconds each on floor and on airex.      Also SLS and 3 way toe taps 10 x each,  both SBA for safety.     Knee/Hip Exercises: Stretches   Passive Hamstring Stretch Limitations  pain anterior supine with strap.  better with strap sitting.  Can't hold it very long.m       Knee/Hip Exercises: Aerobic   Nustep  6 minutes  L5      Knee/Hip Exercises: Machines for Strengthening   Cybex Leg Press  1 plate both 10 X 2 plates both 20 X with ball squeeze for hip  position,  single leg 1 plate 10 x      Knee/Hip Exercises: Standing   Heel Raises  20 reps    Heel Raises Limitations  2 lift , 1 lower  small motions    Forward Step Up  10 reps;Step Height: 6";Both    Step Down  1 set;10 reps;Step Height: 4"    Step Down Limitations  in doorway    Stairs  practiced in PEDS GYM X 4   for exercise   Gait Training  home walking program for home   for exercise   Other Standing Knee Exercises  4 inch side step in door way   Added home walking program for home     Cryotherapy   Number Minutes Cryotherapy  10 Minutes    Cryotherapy Location  Knee    Type of Cryotherapy  --   cold pack     Manual Therapy   Passive ROM  P/A glides with joint ossicilations with gentle ease into flexion  ROM incerased   Passive stretch into extension and flexion,  gentle            PT Education - 03/26/18 1512    Education Details  HEP    Person(s) Educated  Patient    Methods  Explanation;Demonstration;Verbal cues;Handout;Tactile cues    Comprehension  Verbalized understanding       PT Short Term Goals - 03/26/18 1815      PT SHORT TERM GOAL #1   Title   pt to be I with inital HEP     Baseline  says he is independent with initial HEP    Time  4    Period  Weeks    Status  Achieved      PT SHORT TERM GOAL #2   Title  pt to verbalize/ demo proper techniques to reduce pain and inflammation via RICE and HEP     Baseline  understands,  uses    Time  4    Period  Weeks    Status  Achieved      PT SHORT TERM GOAL #3   Baseline  44.1 at joint line at intake,  TODAY 43.5 cm    Time  4    Period  Weeks    Status  Partially Met      PT SHORT TERM GOAL #4   Title  increase knee ROM to </= 10 to >/= 100 to promote ROM and therapeutic progression with </= 5/10 pain     Baseline  121 AAROM flexion    Time  4    Period  Weeks    Status  On-going        PT Long Term Goals - 03/26/18 1817      PT LONG TERM GOAL #1   Title  pt to increse L knee ROM </= 5 degrees to >/= 120 to promote functional and efficient gait pattern with </= 2/10 pain    Baseline  121 AAROM    Time  8    Period  Weeks    Status  On-going      PT LONG TERM GOAL #2   Title  pt to increase LLE strength to >/= 4+/5 to promote knee stability with walking/ standing maximizing safety    Baseline  5/5    Time  8    Period  Weeks    Status  Achieved      PT LONG TERM GOAL #3   Baseline  walks 20 minutes in community,  able to navigate 16 steps in clinic recprocally ( pain not assesses)  2 rails.    Time  8    Period  Weeks    Status  On-going      PT LONG TERM GOAL #4   Title  increase FOTO score to </= 42% limited to demo improvement in function    Time  8    Period  Weeks    Status  On-going      PT LONG TERM GOAL #5   Title  pt to be I with all HEP given as of last visit to maintain and progress current level of function independently    Baseline  continue to add HEP    Time  8    Period  Weeks    Status  On-going            Plan - 03/26/18 1514    Clinical Impression Statement  Prrogressed the HEP today.  AAROM 121.  Mild pain at end of session  addressed with cold pack.  Knee strength  5/5 left.  Girth joint line 43.5 CM( progress toward STG#3) LTG#2 met.  Mild pain distal quads post session this was addressed with cold pack.    PT Next Visit Plan  Gait training.   FOTO ( sorry)  build HEP,  consider step ups or balance.. continue progressive Lt knee ROM and functional strengthening.modalities as indicated.   Balance    PT Home Exercise Plan  quad set, hamstring stretching, SAQ, Heel slide with strap,  sit to stand,  LAQ, heel toe walking    Consulted and Agree with Plan of Care  Patient       Patient will benefit from skilled therapeutic intervention in order to improve the following deficits and impairments:     Visit Diagnosis: Chronic pain of left knee  Localized edema  Other abnormalities of gait and mobility  Stiffness of left knee, not elsewhere classified     Problem List Patient Active Problem List   Diagnosis Date Noted  . Primary localized osteoarthritis of left knee 02/10/2018  . S/P left unicompartmental knee replacement 02/10/2018    HARRIS,KAREN PTA 03/26/2018, 6:28 PM  [place], [place], [address] Phone: [phone]   Fax:  [phone]  Name: Dearies Meikle MRN: [id number] Date of Birth: 05/28/1951     [place] PT, DPT, LAT, ATC  04/02/18  3:38 PM

## 2018-03-31 ENCOUNTER — Encounter: Payer: Self-pay | Admitting: Physical Therapy

## 2018-03-31 ENCOUNTER — Ambulatory Visit: Payer: Medicare Other | Admitting: Physical Therapy

## 2018-03-31 DIAGNOSIS — R600 Localized edema: Secondary | ICD-10-CM

## 2018-03-31 DIAGNOSIS — G8929 Other chronic pain: Secondary | ICD-10-CM

## 2018-03-31 DIAGNOSIS — M25662 Stiffness of left knee, not elsewhere classified: Secondary | ICD-10-CM

## 2018-03-31 DIAGNOSIS — R2689 Other abnormalities of gait and mobility: Secondary | ICD-10-CM

## 2018-03-31 DIAGNOSIS — M25562 Pain in left knee: Principal | ICD-10-CM

## 2018-03-31 NOTE — Therapy (Signed)
[place], [place], [address] Phone: [phone]   Fax:  [phone]  Physical Therapy Treatment  Patient Details  Name: Derek Lara MRN: [id number] Date of Birth: 24-Oct-1950 Referring Provider (PT): Marchia Bond MD   Encounter Date: 03/31/2018  PT End of Session - 03/31/18 1505    Visit Number  11    Number of Visits  17    Date for PT Re-Evaluation  04/21/18    Authorization Type  MCR: kx mod by 15th visit, Progress note by 10th visit    PT Start Time  1503    PT Stop Time  1550    PT Time Calculation (min)  47 min    Activity Tolerance  Patient tolerated treatment well    Behavior During Therapy  [facility] for tasks assessed/performed       Past Medical History:  Diagnosis Date  . Diabetes mellitus without complication ([place])   . Hypertension   . Primary localized osteoarthritis of left knee 02/10/2018    Past Surgical History:  Procedure Laterality Date  . CATARACT EXTRACTION W/ INTRAOCULAR LENS  IMPLANT, BILATERAL    . LEG SURGERY     vascular  . PARTIAL KNEE ARTHROPLASTY Left 02/10/2018   Procedure: LEFT UNICOMPARTMENTAL KNEE;  Surgeon: Marchia Bond, MD;  Location: [place];  Service: Orthopedics;  Laterality: Left;    There were no vitals filed for this visit.  Subjective Assessment - 03/31/18 1504    Subjective  "I only have 1/10 today as far as pain and only have one more visit"     Currently in Pain?  Yes    Pain Score  1     Pain Orientation  Left    Pain Type  Surgical pain    Pain Onset  More than a month ago    Pain Frequency  Intermittent         [facility] PT Assessment - 03/31/18 1510      Observation/Other Assessments   Focus on Therapeutic Outcomes (FOTO)   19% limited      AROM   Left Knee Flexion  119                   [facility] Adult PT Treatment/Exercise - 03/31/18 0001      Exercises   Exercises  Knee/Hip      Knee/Hip Exercises: Stretches   Active Hamstring Stretch  2 reps;30 seconds   with strap     Knee/Hip Exercises: Aerobic   Recumbent Bike  L4 x 6 min       Knee/Hip Exercises: Standing   Other Standing Knee Exercises  wall squats 2 x 15      Knee/Hip Exercises: Seated   Long Arc Quad  2 sets;15 reps   with theraband around ankles   Hamstring Curl  2 sets;15 reps   with green theraband around ankles   Sit to Sand  1 set;10 reps;without UE support      Cryotherapy   Number Minutes Cryotherapy  10 Minutes    Cryotherapy Location  Knee    Type of Cryotherapy  Ice pack          Balance Exercises - 03/31/18 1540      Balance Exercises: Standing   Standing Eyes Opened  2 reps;30 secs;Narrow base of support (BOS)    Standing Eyes Closed  2 reps;30 secs;Narrow base of support (BOS)        PT Education -  03/31/18 1542    Education Details  updated HEP and reviewed previous HEP    Person(s) Educated  Patient    Methods  Explanation;Verbal cues;Handout;Demonstration    Comprehension  Verbalized understanding;Verbal cues required;Returned demonstration       PT Short Term Goals - 03/26/18 1815      PT SHORT TERM GOAL #1   Title  pt to be I with inital HEP     Baseline  says he is independent with initial HEP    Time  4    Period  Weeks    Status  Achieved      PT SHORT TERM GOAL #2   Title  pt to verbalize/ demo proper techniques to reduce pain and inflammation via RICE and HEP     Baseline  understands,  uses    Time  4    Period  Weeks    Status  Achieved      PT SHORT TERM GOAL #3   Baseline  44.1 at joint line at intake,  TODAY 43.5 cm    Time  4    Period  Weeks    Status  Partially Met      PT SHORT TERM GOAL #4   Title  increase knee ROM to </= 10 to >/= 100 to promote ROM and therapeutic progression with </= 5/10 pain     Baseline  121 AAROM flexion    Time  4    Period  Weeks    Status  On-going        PT Long Term Goals - 03/26/18 1817      PT LONG TERM GOAL #1   Title  pt  to increse L knee ROM </= 5 degrees to >/= 120 to promote functional and efficient gait pattern with </= 2/10 pain    Baseline  121 AAROM    Time  8    Period  Weeks    Status  On-going      PT LONG TERM GOAL #2   Title  pt to increase LLE strength to >/= 4+/5 to promote knee stability with walking/ standing maximizing safety    Baseline  5/5    Time  8    Period  Weeks    Status  Achieved      PT LONG TERM GOAL #3   Baseline  walks 20 minutes in community,  able to navigate 16 steps in clinic recprocally ( pain not assesses)  2 rails.    Time  8    Period  Weeks    Status  On-going      PT LONG TERM GOAL #4   Title  increase FOTO score to </= 42% limited to demo improvement in function    Time  8    Period  Weeks    Status  On-going      PT LONG TERM GOAL #5   Title  pt to be I with all HEP given as of last visit to maintain and progress current level of function independently    Baseline  continue to add HEP    Time  8    Period  Weeks    Status  On-going            Plan - 03/31/18 1542    Clinical Impression Statement  pt continues to make progress with knee ROM increasing AROM flexion to 119 degrees today was able to perform all exercises with minimal report of  tightness in the anterior aspect of the knee. updated HEP continued knee/ hip strengthening which he performed well. Continued ice end of session for soreness end of session.     PT Next Visit Plan  ROM, strength, goals, build HEP, potentially D/C    PT Home Exercise Plan  quad set, hamstring stretching, SAQ, Heel slide with strap,  sit to stand,  LAQ, heel toe walking    Consulted and Agree with Plan of Care  Patient    Family Member Consulted  wife       Patient will benefit from skilled therapeutic intervention in order to improve the following deficits and impairments:  Pain, Abnormal gait, Decreased strength, Decreased knowledge of use of DME, Decreased activity tolerance, Decreased endurance, Increased  fascial restricitons, Increased edema, Postural dysfunction, Improper body mechanics, Decreased knowledge of precautions, Increased muscle spasms, Decreased balance  Visit Diagnosis: Chronic pain of left knee  Localized edema  Other abnormalities of gait and mobility  Stiffness of left knee, not elsewhere classified     Problem List Patient Active Problem List   Diagnosis Date Noted  . Primary localized osteoarthritis of left knee 02/10/2018  . S/P left unicompartmental knee replacement 02/10/2018   Starr Lake PT, DPT, LAT, ATC  03/31/18  3:45 PM      [place] [facility] [address] [place], [place], [address] Phone: [phone]   Fax:  [phone]  Name: Derek Lara MRN: [id number] Date of Birth: [birth year]

## 2018-04-02 ENCOUNTER — Encounter: Payer: Self-pay | Admitting: Physical Therapy

## 2018-04-02 ENCOUNTER — Ambulatory Visit: Payer: Medicare Other | Admitting: Physical Therapy

## 2018-04-02 DIAGNOSIS — R2689 Other abnormalities of gait and mobility: Secondary | ICD-10-CM

## 2018-04-02 DIAGNOSIS — G8929 Other chronic pain: Secondary | ICD-10-CM

## 2018-04-02 DIAGNOSIS — M25562 Pain in left knee: Secondary | ICD-10-CM | POA: Diagnosis not present

## 2018-04-02 DIAGNOSIS — R600 Localized edema: Secondary | ICD-10-CM

## 2018-04-02 DIAGNOSIS — M25662 Stiffness of left knee, not elsewhere classified: Secondary | ICD-10-CM

## 2018-04-02 NOTE — Therapy (Signed)
[place], [place], [address] Phone: [phone]   Fax:  [phone]  Physical Therapy Treatment / Discharge summary  Patient Details  Name: Derek Lara MRN: [id number] Date of Birth: 06-Jun-1951 Referring Provider (PT): Marchia Bond MD   Encounter Date: 04/02/2018  PT End of Session - 04/02/18 1501    Visit Number  12    Number of Visits  17    Date for PT Re-Evaluation  04/21/18    Authorization Type  MCR: kx mod by 15th visit, Progress note by 20th visit    PT Start Time  1501    PT Stop Time  1540    PT Time Calculation (min)  39 min    Activity Tolerance  Patient tolerated treatment well    Behavior During Therapy  [facility] for tasks assessed/performed       Past Medical History:  Diagnosis Date  . Diabetes mellitus without complication ([place])   . Hypertension   . Primary localized osteoarthritis of left knee 02/10/2018    Past Surgical History:  Procedure Laterality Date  . CATARACT EXTRACTION W/ INTRAOCULAR LENS  IMPLANT, BILATERAL    . LEG SURGERY     vascular  . PARTIAL KNEE ARTHROPLASTY Left 02/10/2018   Procedure: LEFT UNICOMPARTMENTAL KNEE;  Surgeon: Marchia Bond, MD;  Location: [place];  Service: Orthopedics;  Laterality: Left;    There were no vitals filed for this visit.  Subjective Assessment - 04/02/18 1502    Subjective  "I am doing good, no pain today. today is my last day"     Currently in Pain?  No/denies    Pain Score  0-No pain    Aggravating Factors   N/A    Pain Relieving Factors  ice         [facility] PT Assessment - 04/02/18 1505      AROM   Left Knee Extension  5    Left Knee Flexion  119      PROM   Left Knee Flexion  121      Strength   Left Knee Flexion  5/5    Left Knee Extension  5/5                   [facility] Adult PT Treatment/Exercise - 04/02/18 1504      Exercises   Exercises  Knee/Hip      Knee/Hip Exercises: Stretches   Active Hamstring Stretch  2 reps;30 seconds   with strap     Knee/Hip Exercises: Aerobic   Recumbent Bike  L4 x 7 min    increasing endurance     Knee/Hip Exercises: Standing   Hip Abduction  2 sets;Both;15 reps;Knee straight    Hip Extension  2 sets;15 reps;Knee straight;Both   cues to keep knee straight throughout exercise     Cryotherapy   Number Minutes Cryotherapy  10 Minutes    Cryotherapy Location  Knee    Type of Cryotherapy  Ice pack             PT Education - 04/02/18 1529    Education Details  reviewed previously provided HEP and benefits of continued exercise to maintain and progress current level of function. updated HEP for standing hip strengthening. answered pt questions regrading fluctuating swelling.     Person(s) Educated  Patient    Methods  Explanation;Verbal cues;Handout    Comprehension  Verbalized understanding;Verbal cues required       PT  Short Term Goals - 04/02/18 1514      PT SHORT TERM GOAL #1   Title  pt to be I with inital HEP     Time  4    Period  Weeks    Status  Achieved      PT SHORT TERM GOAL #2   Title  pt to verbalize/ demo proper techniques to reduce pain and inflammation via RICE and HEP     Period  Weeks    Status  Achieved      PT SHORT TERM GOAL #3   Title  reduce L knee edema by >/= 2 cm overall to increase ROM and promote therapeutic progression    Period  Weeks    Status  Achieved      PT SHORT TERM GOAL #4   Title  increase knee ROM to </= 10 to >/= 100 to promote ROM and therapeutic progression with </= 5/10 pain     Status  Achieved        PT Long Term Goals - 04/02/18 1514      PT LONG TERM GOAL #1   Title  pt to increse L knee ROM </= 5 degrees to >/= 120 to promote functional and efficient gait pattern with </= 2/10 pain    Baseline  5 degrees extension and 119 flexion    Status  Partially Met      PT LONG TERM GOAL #2   Title  pt to increase LLE strength to >/= 4+/5 to promote knee stability with  walking/ standing maximizing safety    Period  Weeks    Status  Achieved      PT LONG TERM GOAL #3   Title  pt to be able to stand and walk >/= 45 min  and navigate up/ down >/= 16 steps reciprocally with </= 1 HHA for safety with </=1/10 pain for functional mobility     Time  8    Period  Weeks    Status  Achieved      PT LONG TERM GOAL #4   Title  increase FOTO score to </= 42% limited to demo improvement in function    Time  8    Period  Weeks    Status  Achieved      PT LONG TERM GOAL #5   Title  pt to be I with all HEP given as of last visit to maintain and progress current level of function independently    Time  8    Period  Weeks    Status  Achieved            Plan - 04/02/18 1532    Clinical Impression Statement  Derek Lara has made great progress with physical therapy increasing knee ROM and strength. He was able to perform exercise with minimal report of soreness in the knee only during recumbent bike likely due to stretching. He has met or partially met all goals today, and is able to maintain and progress his current level of function independently and will be discharged from PT today.     PT Next Visit Plan  D/C    Consulted and Agree with Plan of Care  Patient       Patient will benefit from skilled therapeutic intervention in order to improve the following deficits and impairments:  Pain, Abnormal gait, Decreased strength, Decreased knowledge of use of DME, Decreased activity tolerance, Decreased endurance, Increased fascial restricitons, Increased edema, Postural dysfunction,  Improper body mechanics, Decreased knowledge of precautions, Increased muscle spasms, Decreased balance  Visit Diagnosis: Chronic pain of left knee  Localized edema  Other abnormalities of gait and mobility  Stiffness of left knee, not elsewhere classified     Problem List Patient Active Problem List   Diagnosis Date Noted  . Primary localized osteoarthritis of left knee  02/10/2018  . S/P left unicompartmental knee replacement 02/10/2018    Starr Lake 04/02/2018, 3:34 PM  [facility] [address] [place], [place], [address] Phone: [phone]   Fax:  [phone]  Name: Derek Lara MRN: [id number] Date of Birth: April 26, 1951     PHYSICAL THERAPY DISCHARGE SUMMARY  Visits from Start of Care: 12  Current functional level related to goals / functional outcomes: See goals, FOTO 19% limited   Remaining deficits: Mild soreness located along the medial pole of the patella with deep knee bending.    Education / Equipment: HEP, theraband, posture,   Plan: Patient agrees to discharge.  Patient goals were partially met. Patient is being discharged due to being pleased with the current functional level.  ?????         Draven Laine PT, DPT, LAT, ATC  04/02/18  3:36 PM

## 2020-04-05 ENCOUNTER — Other Ambulatory Visit: Payer: Self-pay

## 2020-04-05 ENCOUNTER — Ambulatory Visit: Payer: Medicare Other | Attending: Orthopedic Surgery | Admitting: Physical Therapy

## 2020-04-05 ENCOUNTER — Encounter: Payer: Self-pay | Admitting: Physical Therapy

## 2020-04-05 DIAGNOSIS — M79604 Pain in right leg: Secondary | ICD-10-CM

## 2020-04-05 DIAGNOSIS — R2689 Other abnormalities of gait and mobility: Secondary | ICD-10-CM | POA: Insufficient documentation

## 2020-04-05 NOTE — Therapy (Signed)
[facility] [address] [place], [place], [address] Phone: [phone]   Fax:  [phone]  Physical Therapy Evaluation  Patient Details  Name: Derek Lara MRN: [id number] Date of Birth: 1950-06-27 Referring Provider (PT): Teryl Lucy, MD    Encounter Date: 04/05/2020   PT End of Session - 04/05/20 1428    Visit Number 1    Number of Visits 7    Date for PT Re-Evaluation 04/26/20    Authorization Type MCR: Kx mod at 15th visit    Authorization Time Period FOTO at 6th and 10th    Progress Note Due on Visit 10    PT Start Time 1416    PT Stop Time 1458    PT Time Calculation (min) 42 min    Activity Tolerance Patient tolerated treatment well    Behavior During Therapy [facility] for tasks assessed/performed           Past Medical History:  Diagnosis Date  . Diabetes mellitus without complication ([facility])   . Hypertension   . Primary localized osteoarthritis of left knee 02/10/2018    Past Surgical History:  Procedure Laterality Date  . CATARACT EXTRACTION W/ INTRAOCULAR LENS  IMPLANT, BILATERAL    . LEG SURGERY     vascular  . PARTIAL KNEE ARTHROPLASTY Left 02/10/2018   Procedure: LEFT UNICOMPARTMENTAL KNEE;  Surgeon: Teryl Lucy, MD;  [facility];  Service: Orthopedics;  Laterality: Left;    There were no vitals filed for this visit.    Subjective Assessment - 04/05/20 1421    Subjective pt is a 69 y.o CC of R knee/ hip pain which started last sunday with a fall in the kitchen on 03/26/2020 where the R leg when out to the side. since the fall the pain has gotten alittle better, pain is located along the inside of the leg into the groin and into the calf. pt denies any N/T noting mostly just pain. pt plans to have unicompartmental knee replacement end of November.    How long can you sit comfortably? unlimited    How long can you stand comfortably? 30 min    How long can you walk comfortably? 30 min     Diagnostic tests N/A    Patient Stated Goals decrease knee, hip and calf pain    Currently in Pain? Yes    Pain Score 7    last took meds at 10am   Pain Location Leg    Pain Orientation Right    Pain Descriptors / Indicators Sore    Pain Type Acute pain    Pain Onset More than a month ago    Pain Frequency Intermittent    Aggravating Factors  drivinig,occasionally standing/ walking, getting out of the chair    Pain Relieving Factors ice, medication.    Effect of Pain on Daily Activities intermittent limited endurance              [facility] PT Assessment - 04/05/20 1416      Assessment   Medical Diagnosis R knee strain, R hip OA    Referring Provider (PT) Teryl Lucy, MD     Onset Date/Surgical Date 03/26/20    Hand Dominance Right    Next MD Visit 04/24/2020    Prior Therapy yes   for L partial knee     Precautions   Precautions None      Restrictions   Weight Bearing Restrictions No      Balance Screen  Has the patient fallen in the past 6 months Yes    How many times? 1    Has the patient had a decrease in activity level because of a fear of falling?  No    Is the patient reluctant to leave their home because of a fear of falling?  No      Home [employer]ist information centre manager residence    Living Arrangements Spouse/significant other    Available Help at Discharge Family    Type of Home House    Home Access Stairs to enter    Entrance Stairs-Number of Steps 16    Entrance Stairs-Rails Left   ascending   Home Layout One level    Home Equipment [place] - single point      Prior Function   Level of Independence Independent with basic ADLs    Vocation Retired      IT consultant   Overall Cognitive Status Within Functional Limits for tasks assessed      Observation/Other Assessments   Focus on Therapeutic Outcomes (FOTO)  69%    predicted 79% function     ROM / Strength   AROM / PROM / Strength PROM;AROM;Strength      AROM   AROM Assessment Site Knee;Hip     Right/Left Hip Right;Left    Right/Left Knee Right;Left    Right Knee Flexion 97   end range pain     PROM   PROM Assessment Site Knee    Right/Left Knee Right    Right Knee Extension 5    Right Knee Flexion 103      Strength   Overall Strength Comments pain with both gastroc/ soleus testing with more soreness noted with soleus    Strength Assessment Site Hip;Knee    Right/Left Hip Right;Left    Right Hip Flexion 4+/5   pai during testing    Right Hip ABduction 4+/5    Right Hip ADduction 4/5   pain during testing   Left Hip Flexion 4+/5    Left Hip ABduction 4+/5    Right/Left Knee Right;Left    Right Knee Flexion 4+/5   pain during testing   Right Knee Extension 5/5   no pain during testing   Left Knee Flexion 5/5    Left Knee Extension 5/5      Palpation   Palpation comment TTP along the medial gastroc/ soleus and R hip adducotrs specifically adductor longus and semi-tendonous.      Ambulation/Gait   Ambulation/Gait Yes    Assistive device Straight cane    Gait Pattern Step-through pattern;Decreased stride length;Antalgic;Decreased stance time - right;Decreased step length - left                      Objective measurements completed on examination: See above findings.       [facility] Adult PT Treatment/Exercise - 04/05/20 1416      Exercises   Exercises Knee/Hip      Knee/Hip Exercises: Stretches   Active Hamstring Stretch 2 reps;30 seconds    Gastroc Stretch 1 rep;Both;30 seconds    Soleus Stretch 1 rep;Both;30 seconds    Other Knee/Hip Stretches adductor stretch seated RLE  1 x 30                  PT Education - 04/05/20 1501    Education Details evaluation findings, POC, goals, HEP with proper form/ rationale.    Person(s) Educated Patient    Methods  Explanation;Verbal cues;Handout    Comprehension Verbalized understanding;Verbal cues required            PT Short Term Goals - 04/05/20 1537      PT SHORT TERM GOAL #1   Title  pt to be I with inital HEP    Baseline -    Time 2    Period Weeks    Status New    Target Date 04/19/20      PT SHORT TERM GOAL #2   Title -      PT SHORT TERM GOAL #3   Title -             PT Long Term Goals - 04/05/20 1537      PT LONG TERM GOAL #1   Title pt to report </= 2/10 pain in the R hip adductors and gastroc/ soleus to promote improvement in condition    Time 3    Period Weeks    Status New    Target Date 04/26/20      PT LONG TERM GOAL #2   Title pt to be able to sit stand and walk for >/= 1 hour with no limtations and </= 2/10 pain for functional endurnace required for community amb.    Time 3    Period Weeks    Status New    Target Date 04/26/20      PT LONG TERM GOAL #3   Title increase FOTO score to >/=79% to demo improvement in function    Baseline -    Time 3    Period Weeks    Status New    Target Date 04/26/20      PT LONG TERM GOAL #4   Title pt to be IND with all HEP given and is able to maintain and progress current LOF IND.    Time 3    Period Weeks    Status New    Target Date 04/26/20      PT LONG TERM GOAL #5   Title -    Baseline -                  Plan - 04/05/20 1428    Clinical Impression Statement pt present to OPPT with CC of R medial leg pain from groin to the calf secondary to a fall in the kitchen that occured on 03/26/2020. pt is planning to having a R unicompartment knee replacement on 11/23 and is scheduled for preadmit at that time.  pt has funcitonal strength with sorenss during testing of the R hamstring, hip adductors and flexions. TTP along the adductor longus, semi-tendionosus and gastroc/ soleus on the R. He would benefit from physical therpay to decrease R leg pain, reduce muscle stiffness, and maximize gait efficency and maxmize function by addressing the deficits list.    Personal Factors and Comorbidities Comorbidity 1    Comorbidities hx of DM    Examination-Activity Limitations Transfers     Stability/Clinical Decision Making Evolving/Moderate complexity    Clinical Decision Making Moderate    PT Frequency 2x / week    PT Duration 3 weeks    PT Treatment/Interventions ADLs/Self Care Home Management;Cryotherapy;Electrical Stimulation;Iontophoresis 4mg /ml Dexamethasone;Moist Heat;Ultrasound;Therapeutic exercise;Gait training;Stair training;Therapeutic activities;Patient/family education;Manual techniques;Neuromuscular re-education;Taping;Dry needling;Passive range of motion    PT Next Visit Plan review/ update HEP PRN, Review FOTO assessment. STW pRM for calf, hamstring and hip adductors, gross stretching and gentle strengthening, gait training.    PT Home Exercise Plan -  seated adductor stretch, hamstring stretch (supine/ seated), gastroc/ soleus stretch.    Consulted and Agree with Plan of Care Patient           Patient will benefit from skilled therapeutic intervention in order to improve the following deficits and impairments:  Improper body mechanics, Increased muscle spasms, Decreased strength, Pain, Decreased endurance, Decreased activity tolerance, Decreased range of motion  Visit Diagnosis: Pain in right leg  Other abnormalities of gait and mobility     Problem List Patient Active Problem List   Diagnosis Date Noted  . Primary localized osteoarthritis of left knee 02/10/2018  . S/P left unicompartmental knee replacement 02/10/2018    Lulu RidingKristoffer Alishba Naples PT, DPT, LAT, ATC  04/05/20  3:43 PM      [facility] [address] [place], [place], [address] Phone: [phone]   Fax:  [phone]  Name: Derek Lara MRN: [id number] Date of Birth: 1950-07-25

## 2020-04-18 ENCOUNTER — Ambulatory Visit: Payer: Medicare Other | Attending: Orthopedic Surgery | Admitting: Physical Therapy

## 2020-04-18 ENCOUNTER — Other Ambulatory Visit: Payer: Self-pay

## 2020-04-18 ENCOUNTER — Encounter: Payer: Self-pay | Admitting: Physical Therapy

## 2020-04-18 DIAGNOSIS — M79604 Pain in right leg: Secondary | ICD-10-CM | POA: Insufficient documentation

## 2020-04-18 DIAGNOSIS — R2689 Other abnormalities of gait and mobility: Secondary | ICD-10-CM | POA: Diagnosis present

## 2020-04-19 ENCOUNTER — Encounter: Payer: Self-pay | Admitting: Physical Therapy

## 2020-04-19 NOTE — Therapy (Signed)
[facility] Outpatient Rehabilitation [facility] [address] [place], [place], [address] Phone: [phone]   Fax:  [phone]  Physical Therapy Treatment  Patient Details  Name: Derek Lara MRN: [id number] Date of Birth: Jan 19, 1951 Referring Provider (PT): Teryl Lucy, MD    Encounter Date: 04/18/2020   PT End of Session - 04/18/20 1553    Visit Number 2    Number of Visits 7    Date for PT Re-Evaluation 04/26/20    Authorization Type MCR: Kx mod at 15th visit    Authorization Time Period FOTO at 6th and 10th    PT Start Time 1545    PT Stop Time 1628    PT Time Calculation (min) 43 min    Activity Tolerance Patient tolerated treatment well    Behavior During Therapy [facility] for tasks assessed/performed           Past Medical History:  Diagnosis Date  . Diabetes mellitus without complication (HCC)   . Hypertension   . Primary localized osteoarthritis of left knee 02/10/2018    Past Surgical History:  Procedure Laterality Date  . CATARACT EXTRACTION W/ INTRAOCULAR LENS  IMPLANT, BILATERAL    . LEG SURGERY     vascular  . PARTIAL KNEE ARTHROPLASTY Left 02/10/2018   Procedure: LEFT UNICOMPARTMENTAL KNEE;  Surgeon: Teryl Lucy, MD;  Location: [facility] OR;  Service: Orthopedics;  Laterality: Left;    There were no vitals filed for this visit.   Subjective Assessment - 04/18/20 1549    Subjective Patient reports the right knee is a little sore and he is having some pain in his medlial lower leg and right knee. He is having some pain when he is driving.    How long can you sit comfortably? unlimited    How long can you stand comfortably? 30 min    Diagnostic tests N/A    Patient Stated Goals decrease knee, hip and calf pain    Currently in Pain? Yes    Pain Score 6     Pain Location Leg    Pain Orientation Right    Pain Descriptors / Indicators Aching    Pain Type Acute pain    Pain Onset More than a month ago    Pain Frequency Intermittent     Aggravating Factors  sriving    Pain Relieving Factors ice    Effect of Pain on Daily Activities pain with driving                             [facility] Adult PT Treatment/Exercise - 04/19/20 0001      Knee/Hip Exercises: Stretches   Active Hamstring Stretch 2 reps;30 seconds    Gastroc Stretch 1 rep;Both;30 seconds    Gastroc Stretch Limitations reviewed sel stretch with strap seated     Soleus Stretch Limitations self stretch with srap seated     Other Knee/Hip Stretches gentle LTR 2x10       Knee/Hip Exercises: Standing   Other Standing Knee Exercises standing weight shift 2x10     Other Standing Knee Exercises slow standing march with emphasis on decreasing lateral movement       Knee/Hip Exercises: Seated   Other Seated Knee/Hip Exercises hip andcution to improve glut strength with single leg stance. x20 red       Manual Therapy   Manual Therapy Soft tissue mobilization;Passive ROM    Soft tissue mobilization to medial calf; release to posterior  knee;     Passive ROM Manual groin stretch and ip flexion strength                   PT Education - 04/18/20 1552    Education Details ewed benefits of stretching and strengthening    Person(s) Educated Patient    Methods Explanation    Comprehension Verbalized understanding;Returned demonstration;Verbal cues required;Tactile cues required            PT Short Term Goals - 04/05/20 1537      PT SHORT TERM GOAL #1   Title pt to be I with inital HEP    Baseline -    Time 2    Period Weeks    Status New    Target Date 04/19/20      PT SHORT TERM GOAL #2   Title -      PT SHORT TERM GOAL #3   Title -             PT Long Term Goals - 04/05/20 1537      PT LONG TERM GOAL #1   Title pt to report </= 2/10 pain in the R hip adductors and gastroc/ soleus to promote improvement in condition    Time 3    Period Weeks    Status New    Target Date 04/26/20      PT LONG TERM GOAL #2   Title pt  to be able to sit stand and walk for >/= 1 hour with no limtations and </= 2/10 pain for functional endurnace required for community amb.    Time 3    Period Weeks    Status New    Target Date 04/26/20      PT LONG TERM GOAL #3   Title increase FOTO score to >/=79% to demo improvement in function    Baseline -    Time 3    Period Weeks    Status New    Target Date 04/26/20      PT LONG TERM GOAL #4   Title pt to be IND with all HEP given and is able to maintain and progress current LOF IND.    Time 3    Period Weeks    Status New    Target Date 04/26/20      PT LONG TERM GOAL #5   Title -    Baseline -                 Plan - 04/19/20 0952    Clinical Impression Statement Patient had increased gorin pain wehn straighrening his leg. He has been having pain while using the gas pedal. Therapy foucesed on light stetching and soft tissue mobilization of the medial calf and triggetr point release to the hamstring. he was shown a seated hamstirng stretch that did nbot exacerbate his groing. He was also given light standing exercises to work on weight shifting and weight bearing. He was given an updated HEP. He would benefit from further PT to continue to progress.    Personal Factors and Comorbidities Comorbidity 1    Comorbidities hx of DM    Examination-Activity Limitations Transfers    Stability/Clinical Decision Making Evolving/Moderate complexity    Clinical Decision Making Moderate    Rehab Potential Poor    PT Frequency 2x / week    PT Duration 3 weeks    PT Treatment/Interventions ADLs/Self Care Home Management;Cryotherapy;Electrical Stimulation;Iontophoresis 4mg /ml Dexamethasone;Moist Heat;Ultrasound;Therapeutic exercise;Gait training;Stair training;Therapeutic activities;Patient/family  education;Manual techniques;Neuromuscular re-education;Taping;Dry needling;Passive range of motion    PT Next Visit Plan review/ update HEP PRN, Review FOTO assessment. STW pRM for calf,  hamstring and hip adductors, gross stretching and gentle strengthening, gait training.    PT Home Exercise Plan [id number] - seated adductor stretch, hamstring stretch (supine/ seated), gastroc/ soleus stretch.    Consulted and Agree with Plan of Care Patient           Patient will benefit from skilled therapeutic intervention in order to improve the following deficits and impairments:  Improper body mechanics, Increased muscle spasms, Decreased strength, Pain, Decreased endurance, Decreased activity tolerance, Decreased range of motion  Visit Diagnosis: Pain in right leg  Other abnormalities of gait and mobility     Problem List Patient Active Problem List   Diagnosis Date Noted  . Primary localized osteoarthritis of left knee 02/10/2018  . S/P left unicompartmental knee replacement 02/10/2018    Dessie Coma 04/19/2020, 10:34 AM  [facility] [address] [place], [place], [address] Phone: [phone]   Fax:  [phone]  Name: Derek Lara MRN: [id number] Date of Birth: February 08, 1951

## 2020-04-20 ENCOUNTER — Encounter: Payer: Self-pay | Admitting: Physical Therapy

## 2020-04-20 ENCOUNTER — Ambulatory Visit: Payer: Medicare Other | Admitting: Physical Therapy

## 2020-04-20 ENCOUNTER — Other Ambulatory Visit: Payer: Self-pay

## 2020-04-20 DIAGNOSIS — M79604 Pain in right leg: Secondary | ICD-10-CM

## 2020-04-20 DIAGNOSIS — R2689 Other abnormalities of gait and mobility: Secondary | ICD-10-CM

## 2020-04-20 NOTE — Therapy (Signed)
[facility] Outpatient Rehabilitation [facility] [address] [place], [place], [address] Phone: [phone]   Fax:  [phone]  Physical Therapy Treatment  Patient Details  Name: Derek Lara MRN: [id number]  Date of Birth: [birth year] Referring Provider (PT): Teryl Lucy, MD    Encounter Date: 04/20/2020   PT End of Session - 04/20/20 1426    Visit Number 3    Number of Visits 7    Date for PT Re-Evaluation 04/26/20    Authorization Type MCR: Kx mod at 15th visit    Authorization Time Period FOTO at 6th and 10th    PT Start Time 1415    PT Stop Time 1458    PT Time Calculation (min) 43 min    Activity Tolerance Patient tolerated treatment well    Behavior During Therapy [facility] for tasks assessed/performed           Past Medical History:  Diagnosis Date  . Diabetes mellitus without complication (HCC)   . Hypertension   . Primary localized osteoarthritis of left knee 02/10/2018    Past Surgical History:  Procedure Laterality Date  . CATARACT EXTRACTION W/ INTRAOCULAR LENS  IMPLANT, BILATERAL    . LEG SURGERY     vascular  . PARTIAL KNEE ARTHROPLASTY Left 02/10/2018   Procedure: LEFT UNICOMPARTMENTAL KNEE;  Surgeon: Teryl Lucy, MD;  Location: [facility] OR;  Service: Orthopedics;  Laterality: Left;    There were no vitals filed for this visit.   Subjective Assessment - 04/20/20 1424    Subjective Patient rreports the groin is a little better. He is having a little pain in his medial calf area.    How long can you sit comfortably? unlimited    How long can you stand comfortably? 30 min    How long can you walk comfortably? 30 min    Diagnostic tests N/A    Patient Stated Goals decrease knee, hip and calf pain    Currently in Pain? Yes    Pain Score 3     Pain Location Leg    Pain Orientation Lower    Pain Descriptors / Indicators Aching    Pain Type Chronic pain    Pain Onset More than a month ago    Pain Frequency Intermittent     Aggravating Factors  standing and walking    Pain Relieving Factors ice    Effect of Pain on Daily Activities pain and driving                             [facility] Adult PT Treatment/Exercise - 04/20/20 0001      Knee/Hip Exercises: Stretches   Active Hamstring Stretch 2 reps;30 seconds      Knee/Hip Exercises: Aerobic   Nustep L1 5 min       Knee/Hip Exercises: Standing   Other Standing Knee Exercises standing weight shift 2x10     Other Standing Knee Exercises slow standing march with emphasis on decreasing lateral movement       Knee/Hip Exercises: Seated   Other Seated Knee/Hip Exercises clamshell red band 2x10       Knee/Hip Exercises: Supine   Quad Sets Limitations quad set on ball 2x10 5 sec hold       Manual Therapy   Manual Therapy Soft tissue mobilization;Passive ROM    Soft tissue mobilization to medial calf; release to posterior knee;     Passive ROM Manual groin stretch and ip flexion  strength       Ankle Exercises: Supine   Other Supine Ankle Exercises ankle DF and PF 2x10 each red band                   PT Education - 04/20/20 1426    Education Details reviewed stretching and strengthening program    Person(s) Educated Patient    Methods Explanation;Demonstration;Tactile cues;Verbal cues    Comprehension Verbalized understanding;Returned demonstration;Verbal cues required;Tactile cues required            PT Short Term Goals - 04/05/20 1537      PT SHORT TERM GOAL #1   Title pt to be I with inital HEP    Baseline -    Time 2    Period Weeks    Status New    Target Date 04/19/20      PT SHORT TERM GOAL #2   Title -      PT SHORT TERM GOAL #3   Title -             PT Long Term Goals - 04/05/20 1537      PT LONG TERM GOAL #1   Title pt to report </= 2/10 pain in the R hip adductors and gastroc/ soleus to promote improvement in condition    Time 3    Period Weeks    Status New    Target Date 04/26/20      PT  LONG TERM GOAL #2   Title pt to be able to sit stand and walk for >/= 1 hour with no limtations and </= 2/10 pain for functional endurnace required for community amb.    Time 3    Period Weeks    Status New    Target Date 04/26/20      PT LONG TERM GOAL #3   Title increase FOTO score to >/=79% to demo improvement in function    Baseline -    Time 3    Period Weeks    Status New    Target Date 04/26/20      PT LONG TERM GOAL #4   Title pt to be IND with all HEP given and is able to maintain and progress current LOF IND.    Time 3    Period Weeks    Status New    Target Date 04/26/20      PT LONG TERM GOAL #5   Title -    Baseline -                 Plan - 04/20/20 1614    Clinical Impression Statement Patient is making progress. he less pain in his groin today. The only time he had pain in his groin was with ankle DF. He tolerated quad sets well. He had less tenderness to palpation in his medial lower leg. Therapy will continue to progress standing exercises as right knee allows. He reports only minor pain in his right knee.    Comorbidities hx of DM    Examination-Activity Limitations Transfers    Stability/Clinical Decision Making Evolving/Moderate complexity    Clinical Decision Making Moderate    Rehab Potential Poor    PT Frequency 2x / week    PT Treatment/Interventions ADLs/Self Care Home Management;Cryotherapy;Electrical Stimulation;Iontophoresis 4mg /ml Dexamethasone;Moist Heat;Ultrasound;Therapeutic exercise;Gait training;Stair training;Therapeutic activities;Patient/family education;Manual techniques;Neuromuscular re-education;Taping;Dry needling;Passive range of motion    PT Next Visit Plan continue to progress exercises. Consider adding ankle exercises to home program. progress standing exercises  if knee allows    PT Home Exercise Plan [id number] - seated adductor stretch, hamstring stretch (supine/ seated), gastroc/ soleus stretch.    Consulted and Agree with  Plan of Care Patient           Patient will benefit from skilled therapeutic intervention in order to improve the following deficits and impairments:  Improper body mechanics, Increased muscle spasms, Decreased strength, Pain, Decreased endurance, Decreased activity tolerance, Decreased range of motion  Visit Diagnosis: Pain in right leg  Other abnormalities of gait and mobility     Problem List Patient Active Problem List   Diagnosis Date Noted  . Primary localized osteoarthritis of left knee 02/10/2018  . S/P left unicompartmental knee replacement 02/10/2018    Dessie Coma PT DPT  04/20/2020, 5:36 PM  [facility] [address] [place], [place], [address] Phone: [phone]   Fax:  [phone]  Name: Derek Lara MRN: [id number] Date of Birth: February 20, 1951

## 2020-04-25 ENCOUNTER — Encounter: Payer: Self-pay | Admitting: Physical Therapy

## 2020-04-25 ENCOUNTER — Other Ambulatory Visit: Payer: Self-pay

## 2020-04-25 ENCOUNTER — Ambulatory Visit: Payer: Medicare Other | Admitting: Physical Therapy

## 2020-04-25 DIAGNOSIS — M79604 Pain in right leg: Secondary | ICD-10-CM

## 2020-04-25 DIAGNOSIS — R2689 Other abnormalities of gait and mobility: Secondary | ICD-10-CM

## 2020-04-25 NOTE — Therapy (Signed)
[facility] Outpatient Rehabilitation [facility] [address] [place], [place], [address] Phone: [phone]   Fax:  [phone]  Physical Therapy Treatment  Patient Details  Name: Derek Lara MRN: [id number] Date of Birth: 03-30-1951 Referring Provider (PT): Teryl Lucy, MD    Encounter Date: 04/25/2020   PT End of Session - 04/25/20 1548    Visit Number 4    Number of Visits 7    Date for PT Re-Evaluation 04/26/20    Authorization Type MCR: Kx mod at 15th visit    Authorization Time Period FOTO at 6th and 10th    PT Start Time 1547    PT Stop Time 1628    PT Time Calculation (min) 41 min    Activity Tolerance Patient tolerated treatment well    Behavior During Therapy [facility] for tasks assessed/performed           Past Medical History:  Diagnosis Date  . Diabetes mellitus without complication (HCC)   . Hypertension   . Primary localized osteoarthritis of left knee 02/10/2018    Past Surgical History:  Procedure Laterality Date  . CATARACT EXTRACTION W/ INTRAOCULAR LENS  IMPLANT, BILATERAL    . LEG SURGERY     vascular  . PARTIAL KNEE ARTHROPLASTY Left 02/10/2018   Procedure: LEFT UNICOMPARTMENTAL KNEE;  Surgeon: Teryl Lucy, MD;  Location: [facility] OR;  Service: Orthopedics;  Laterality: Left;    There were no vitals filed for this visit.   Subjective Assessment - 04/25/20 1548    Subjective "I am doing much better, the ankle is the only is the only issue I have."    Currently in Pain? Yes    Pain Score 7     Pain Location Leg    Pain Orientation Lower    Pain Descriptors / Indicators Aching    Pain Type Chronic pain    Pain Onset More than a month ago    Pain Frequency Intermittent              [facility] PT Assessment - 04/25/20 0001      Assessment   Medical Diagnosis R knee strain, R hip OA    Referring Provider (PT) Teryl Lucy, MD                          [facility] Adult PT Treatment/Exercise - 04/25/20 0001        Knee/Hip Exercises: Stretches   Active Hamstring Stretch 2 reps;30 seconds;Right      Knee/Hip Exercises: Standing   Gait Training heel strike/ toe off taking smaller steps bil (pt was taking longer stride on the R       Manual Therapy   Manual Therapy Joint mobilization    Joint Mobilization grade IV talocrural distraction mobs     Soft tissue mobilization IASTM along achilles with XF techniques      Ankle Exercises: Seated   BAPS Sitting;Level 2   20 reps Df/PF, inversion/ eversion , CW/CCW     Ankle Exercises: Standing   Other Standing Ankle Exercises eccentric calf strengthening in sitting 2  x 10 with 3 second eccentric lengthing   with bil HHA from free motion     Ankle Exercises: Stretches   Gastroc Stretch 4 reps;30 seconds   PNF contract/ relax in long sitting   Slant Board Stretch 2 reps;30 seconds   gasroc  PT Short Term Goals - 04/05/20 1537      PT SHORT TERM GOAL #1   Title pt to be I with inital HEP    Baseline -    Time 2    Period Weeks    Status New    Target Date 04/19/20      PT SHORT TERM GOAL #2   Title -      PT SHORT TERM GOAL #3   Title -             PT Long Term Goals - 04/05/20 1537      PT LONG TERM GOAL #1   Title pt to report </= 2/10 pain in the R hip adductors and gastroc/ soleus to promote improvement in condition    Time 3    Period Weeks    Status New    Target Date 04/26/20      PT LONG TERM GOAL #2   Title pt to be able to sit stand and walk for >/= 1 hour with no limtations and </= 2/10 pain for functional endurnace required for community amb.    Time 3    Period Weeks    Status New    Target Date 04/26/20      PT LONG TERM GOAL #3   Title increase FOTO score to >/=79% to demo improvement in function    Baseline -    Time 3    Period Weeks    Status New    Target Date 04/26/20      PT LONG TERM GOAL #4   Title pt to be IND with all HEP given and is able to maintain and progress  current LOF IND.    Time 3    Period Weeks    Status New    Target Date 04/26/20      PT LONG TERM GOAL #5   Title -    Baseline -                 Plan - 04/25/20 1627    Clinical Impression Statement pt reports most of the pain he had today was located along the achilles tendon which limited his endurance with driving (pressing the gas pedal). focused on the ankle / achilles today due to pt noting no other issues aside from the knee pain which he is planning to have operated on inthe next 2 weeks. He responded well to IASTM techniques and gastroc stretching as well as heel raise with focus on eccentric lowring to promote remodeling. practiced gait training to promote equal step length which he rquired verbal and visual cues for proper form.    PT Next Visit Plan continue to progress exercises. Consider adding ankle exercises to home program. progress standing exercises if knee allows, calf eccentrics sitting vs standing, gait training    Consulted and Agree with Plan of Care Patient           Patient will benefit from skilled therapeutic intervention in order to improve the following deficits and impairments:  Improper body mechanics, Increased muscle spasms, Decreased strength, Pain, Decreased endurance, Decreased activity tolerance, Decreased range of motion  Visit Diagnosis: Pain in right leg  Other abnormalities of gait and mobility     Problem List Patient Active Problem List   Diagnosis Date Noted  . Primary localized osteoarthritis of left knee 02/10/2018  . S/P left unicompartmental knee replacement 02/10/2018   Lulu Riding PT, DPT, LAT, ATC  04/25/20  4:32 PM      [facility] [address] [place], [place], [address] Phone: [phone]   Fax:  [phone]  Name: Derek Lara MRN: [id number] Date of Birth: 11/30/1950

## 2020-04-27 ENCOUNTER — Encounter: Payer: Self-pay | Admitting: Physical Therapy

## 2020-04-27 ENCOUNTER — Encounter (HOSPITAL_COMMUNITY): Payer: Self-pay

## 2020-04-27 ENCOUNTER — Ambulatory Visit: Payer: Medicare Other | Admitting: Physical Therapy

## 2020-04-27 ENCOUNTER — Other Ambulatory Visit: Payer: Self-pay

## 2020-04-27 ENCOUNTER — Encounter (HOSPITAL_COMMUNITY)
Admission: RE | Admit: 2020-04-27 | Discharge: 2020-04-27 | Disposition: A | Discharge status: Home / Self Care | Payer: Medicare Other | Source: Ambulatory Visit | Attending: Orthopedic Surgery | Admitting: Orthopedic Surgery

## 2020-04-27 DIAGNOSIS — R2689 Other abnormalities of gait and mobility: Secondary | ICD-10-CM

## 2020-04-27 DIAGNOSIS — Z01818 Encounter for other preprocedural examination: Secondary | ICD-10-CM | POA: Diagnosis not present

## 2020-04-27 DIAGNOSIS — M79604 Pain in right leg: Secondary | ICD-10-CM

## 2020-04-27 LAB — BASIC METABOLIC PANEL
Anion gap: 9 (ref 5–15)
BUN: 31 mg/dL — ABNORMAL HIGH (ref 8–23)
CO2: 24 mmol/L (ref 22–32)
Calcium: 9.8 mg/dL (ref 8.9–10.3)
Chloride: 106 mmol/L (ref 98–111)
Creatinine, Ser: 1.77 mg/dL — ABNORMAL HIGH (ref 0.61–1.24)
GFR, Estimated: 41 mL/min — ABNORMAL LOW (ref >60)
Glucose, Bld: 101 mg/dL — ABNORMAL HIGH (ref 70–99)
Potassium: 5 mmol/L (ref 3.5–5.1)
Sodium: 139 mmol/L (ref 135–145)

## 2020-04-27 LAB — CBC
HCT: 41.6 % (ref 39.0–52.0)
Hemoglobin: 13.3 g/dL (ref 13.0–17.0)
MCH: 31.1 pg (ref 26.0–34.0)
MCHC: 32 g/dL (ref 30.0–36.0)
MCV: 97.4 fL (ref 80.0–100.0)
Platelets: 319 10*3/uL (ref 150–400)
RBC: 4.27 MIL/uL (ref 4.22–5.81)
RDW: 13.3 % (ref 11.5–15.5)
WBC: 8.7 10*3/uL (ref 4.0–10.5)
nRBC: 0 % (ref 0.0–0.2)

## 2020-04-27 LAB — SURGICAL PCR SCREEN
MRSA, PCR: NEGATIVE
Staphylococcus aureus: NEGATIVE

## 2020-04-27 LAB — GLUCOSE, CAPILLARY: Glucose-Capillary: 99 mg/dL (ref 70–99)

## 2020-04-27 NOTE — Progress Notes (Addendum)
Anesthesia Review:  PCP: Dr Karna Christmas  Cardiologist : none  Chest x-ray : EKG : REquested EKG from [facility]- [phone]  EKG that came by fax was done 08/2018 have ordered ekg for am of surgery  Echo : Stress test: Cardiac Cath :  Activity level:  canoot do a flight of stairs without difficulty  Sleep Study/ CPAP : no  Fasting Blood Sugar :      / Checks Blood Sugar -- times a day:   Blood Thinner/ Instructions /Last Dose: ASA / Instructions/ Last Dose :  hgba1c done 02/2020 on chart- 6.3 Dm- tye 2 checks glucose periodically at home  BMP done 04/27/20 faxed via epic to DR landau.

## 2020-04-27 NOTE — Progress Notes (Signed)
DUE TO COVID-19 ONLY ONE VISITOR IS ALLOWED TO COME WITH YOU AND STAY IN THE WAITING ROOM ONLY DURING PRE OP AND PROCEDURE DAY OF SURGERY. THE 1 VISITOR  MAY VISIT WITH YOU AFTER SURGERY IN YOUR PRIVATE ROOM DURING VISITING HOURS ONLY!  YOU NEED TO HAVE A COVID 19 TEST ON__11/19/2021 _____ @_______ , THIS TEST MUST BE DONE BEFORE SURGERY,  COVID TESTING SITE [address] [place] , IT IS ON THE RIGHT GOING OUT [address] APPROXIMATELY  2 MINUTES PAST [employer] ON THE RIGHT. ONCE YOUR COVID TEST IS COMPLETED,  PLEASE BEGIN THE QUARANTINE INSTRUCTIONS AS OUTLINED IN YOUR HANDOUT.                Derek Lara  04/27/2020   Your procedure is scheduled on: 05/09/2020    Report to [facility] Main  Entrance   Report to admitting at    0730am AM     Call this number if you have problems the morning of surgery [phone]    REMEMBER: NO  SOLID FOOD CANDY OR GUM AFTER MIDNIGHT. CLEAR LIQUIDS UNTIL 0700am        . NOTHING BY MOUTH EXCEPT CLEAR LIQUIDS UNTIL    . PLEASE FINISH ENSURE DRINK PER SURGEON ORDER  WHICH NEEDS TO BE COMPLETED AT  0700am    .      CLEAR LIQUID DIET   Foods Allowed                                                                    Coffee and tea, regular and decaf                            Fruit ices (not with fruit pulp)                                      Iced Popsicles                                    Carbonated beverages, regular and diet                                    Cranberry, grape and apple juices Sports drinks like Gatorade Lightly seasoned clear broth or consume(fat free) Sugar, honey syrup ___________________________________________________________________      BRUSH YOUR TEETH MORNING OF SURGERY AND RINSE YOUR MOUTH OUT, NO CHEWING GUM CANDY OR MINTS.     Take these medicines the morning of surgery with A SIP OF WATER:  Gabapentin, lopressor   DO NOT TAKE ANY DIABETIC MEDICATIONS DAY OF  YOUR SURGERY                               You may not have any metal on your body including hair pins and              piercings  Do not wear  jewelry, make-up, lotions, powders or perfumes, deodorant             Do not wear nail polish on your fingernails.  Do not shave  48 hours prior to surgery.              Men may shave face and neck.   Do not bring valuables to the hospital. [place] IS NOT             RESPONSIBLE   FOR VALUABLES.  Contacts, dentures or bridgework may not be worn into surgery.  Leave suitcase in the car. After surgery it may be brought to your room.     Patients discharged the day of surgery will not be allowed to drive home. IF YOU ARE HAVING SURGERY AND GOING HOME THE SAME DAY, YOU MUST HAVE AN ADULT TO DRIVE YOU HOME AND BE WITH YOU FOR 24 HOURS. YOU MAY GO HOME BY TAXI OR UBER OR ORTHERWISE, BUT AN ADULT MUST ACCOMPANY YOU HOME AND STAY WITH YOU FOR 24 HOURS.  Name and phone number of your driver:  Special Instructions: N/A              Please read over the following fact sheets you were given: _____________________________________________________________________  [facility] - Preparing for Surgery Before surgery, you can play an important role.  Because skin is not sterile, your skin needs to be as free of germs as possible.  You can reduce the number of germs on your skin by washing with CHG (chlorahexidine gluconate) soap before surgery.  CHG is an antiseptic cleaner which kills germs and bonds with the skin to continue killing germs even after washing. Please DO NOT use if you have an allergy to CHG or antibacterial soaps.  If your skin becomes reddened/irritated stop using the CHG and inform your nurse when you arrive at Short Stay. Do not shave (including legs and underarms) for at least 48 hours prior to the first CHG shower.  You may shave your face/neck. Please follow these instructions carefully:  1.  Shower with CHG Soap the night before surgery and  the  morning of Surgery.  2.  If you choose to wash your hair, wash your hair first as usual with your  normal  shampoo.  3.  After you shampoo, rinse your hair and body thoroughly to remove the  shampoo.                           4.  Use CHG as you would any other liquid soap.  You can apply chg directly  to the skin and wash                       Gently with a scrungie or clean washcloth.  5.  Apply the CHG Soap to your body ONLY FROM THE NECK DOWN.   Do not use on face/ open                           Wound or open sores. Avoid contact with eyes, ears mouth and genitals (private parts).                       Wash face,  Genitals (private parts) with your normal soap.             6.  Wash thoroughly, paying special  attention to the area where your surgery  will be performed.  7.  Thoroughly rinse your body with warm water from the neck down.  8.  DO NOT shower/wash with your normal soap after using and rinsing off  the CHG Soap.                9.  Pat yourself dry with a clean towel.            10.  Wear clean pajamas.            11.  Place clean sheets on your bed the night of your first shower and do not  sleep with pets. Day of Surgery : Do not apply any lotions/deodorants the morning of surgery.  Please wear clean clothes to the hospital/surgery center.  FAILURE TO FOLLOW THESE INSTRUCTIONS MAY RESULT IN THE CANCELLATION OF YOUR SURGERY PATIENT SIGNATURE_________________________________  NURSE SIGNATURE__________________________________  ________________________________________________________________________

## 2020-04-27 NOTE — Therapy (Signed)
[facility] [address] [place], [place], [address] Phone: [phone]   Fax:  [phone]  Physical Therapy Treatment / Re-certification  Patient Details  Name: Derek Lara MRN: [id number] Date of Birth: Nov 22, 1950 Referring Provider (PT): Teryl Lucy, MD    Encounter Date: 04/27/2020   PT End of Session - 04/27/20 1552    Visit Number 5    Number of Visits 7    Date for PT Re-Evaluation 05/04/20    Authorization Type MCR: Kx mod at 15th visit    Authorization Time Period FOTO at  10th    Progress Note Due on Visit 10    PT Start Time 1546    PT Stop Time 1624    PT Time Calculation (min) 38 min    Activity Tolerance Patient tolerated treatment well    Behavior During Therapy [facility] for tasks assessed/performed           Past Medical History:  Diagnosis Date  . Diabetes mellitus without complication (HCC)    TYPE 2   . Hypertension   . Primary localized osteoarthritis of left knee 02/10/2018    Past Surgical History:  Procedure Laterality Date  . CATARACT EXTRACTION W/ INTRAOCULAR LENS  IMPLANT, BILATERAL    . LEG SURGERY     vascular  . PARTIAL KNEE ARTHROPLASTY Left 02/10/2018   Procedure: LEFT UNICOMPARTMENTAL KNEE;  Surgeon: Teryl Lucy, MD;  Location: [facility] OR;  Service: Orthopedics;  Laterality: Left;    There were no vitals filed for this visit.   Subjective Assessment - 04/27/20 1553    Subjective " I am doing much better, I do have abotu 4/10 in the calf/ ankle today."    Patient Stated Goals decrease knee, hip and calf pain    Currently in Pain? Yes    Pain Score 4     Pain Location Ankle    Pain Orientation Right    Pain Descriptors / Indicators Aching    Pain Type Chronic pain    Pain Onset More than a month ago    Pain Frequency Intermittent    Aggravating Factors  standing/ walking    Pain Relieving Factors ice, exercise and stretching              [facility] PT Assessment -  04/27/20 0001      Assessment   Medical Diagnosis R knee strain, R hip OA    Referring Provider (PT) Teryl Lucy, MD       Observation/Other Assessments   Focus on Therapeutic Outcomes (FOTO)  52% function      Posture/Postural Control   Posture/Postural Control --                         [facility] Adult PT Treatment/Exercise - 04/27/20 0001      Knee/Hip Exercises: Aerobic   Nustep L1 x 5 min LE only      Knee/Hip Exercises: Standing   Hip Abduction Stengthening;Both;2 sets;10 reps;Knee straight      Manual Therapy   Joint Mobilization grade IV talocrural distraction mobs, and PA mobs to promote PF grade IV    Soft tissue mobilization IASTM along achilles with XF techniques      Ankle Exercises: Stretches   Gastroc Stretch 2 reps;30 seconds   with strap sitting     Ankle Exercises: Standing   Other Standing Ankle Exercises eccentric calf strengthening in sitting 2  x 10 with 3 second eccentric  lengthing      Ankle Exercises: Seated   Heel Raises 10 reps   with controlled eccentrics x 2 sets   Other Seated Ankle Exercises ankle inversion/ eversion and DF 2 x 10 with green theraband                  PT Education - 04/27/20 1632    Education Details FOTO re-assessment, updated HEP.    Person(s) Educated Patient    Methods Explanation;Verbal cues;Handout    Comprehension Verbalized understanding;Verbal cues required            PT Short Term Goals - 04/27/20 1558      PT SHORT TERM GOAL #1   Title pt to be I with inital HEP    Period Weeks    Status Achieved             PT Long Term Goals - 04/27/20 1601      PT LONG TERM GOAL #1   Title pt to report </= 2/10 pain in the R hip adductors and gastroc/ soleus to promote improvement in condition    Period Weeks    Status On-going      PT LONG TERM GOAL #2   Title pt to be able to sit stand and walk for >/= 1 hour with no limtations and </= 2/10 pain for functional endurnace required for  community amb.    Baseline limited walking standing more due to the knee    Period Weeks    Status On-going      PT LONG TERM GOAL #3   Title increase FOTO score to >/=79% to demo improvement in function    Period Weeks    Status On-going      PT LONG TERM GOAL #4   Title pt to be IND with all HEP given and is able to maintain and progress current LOF IND.    Period Weeks    Status On-going                 Plan - 04/27/20 1627    Clinical Impression Statement pt reports 4/10 coming into session. cotninued working STW along the lateral aspect of the achilles and anterior talocrurcal mobs to promote PF which pt noted significant relief of pain during session. continued focus on ankle strengthening pt responded well and provided HEP for seated heel/ toe raise and towel inversion/ eversion. end of session he noted pain decreased to 2/10. he would benefit from continued physical therapy to reduce ankle pain, improve strength, review and update HEP and work toward discharge.    PT Frequency 2x / week    PT Duration 2 weeks    PT Treatment/Interventions ADLs/Self Care Home Management;Cryotherapy;Electrical Stimulation;Iontophoresis 4mg /ml Dexamethasone;Moist Heat;Ultrasound;Therapeutic exercise;Gait training;Stair training;Therapeutic activities;Patient/family education;Manual techniques;Neuromuscular re-education;Taping;Dry needling;Passive range of motion    PT Next Visit Plan continue to progress exercises. Consider adding ankle exercises to home program. progress standing exercises if knee allows, calf eccentrics sitting vs standing, gait training    PT Home Exercise Plan - seated adductor stretch, hamstring stretch (supine/ seated), gastroc/ soleus stretch. heel/ toe raise, towel IR/ER    Consulted and Agree with Plan of Care Patient           Patient will benefit from skilled therapeutic intervention in order to improve the following deficits and impairments:  Improper  body mechanics, Increased muscle spasms, Decreased strength, Pain, Decreased endurance, Decreased activity tolerance, Decreased range of motion  Visit Diagnosis: Pain  in right leg  Other abnormalities of gait and mobility     Problem List Patient Active Problem List   Diagnosis Date Noted  . Primary localized osteoarthritis of left knee 02/10/2018  . S/P left unicompartmental knee replacement 02/10/2018    Lulu Riding PT, DPT, LAT, ATC  04/27/20  4:34 PM      [facility] [address] [place], [place], [address] Phone: [phone]   Fax:  [phone]  Name: Derek Lara MRN: [id number] Date of Birth: Dec 11, 1950

## 2020-04-28 NOTE — H&P (Signed)
KNEE ARTHROPLASTY ADMISSION H&P  Patient ID: Derek Lara MRN: [id number] DOB/AGE: 02/07/1951 69 y.o.  Chief Complaint: right knee pain.  Planned Procedure Date: 05/09/20 Medical Clearance by Dr. Ardelle Park   HPI: Derek Lara is a 69 y.o. male who presents for evaluation of OA RIGHT KNEE. The patient has a history of pain and functional disability in the right knee due to arthritis and has failed non-surgical conservative treatments for greater than 12 weeks to include NSAID's and/or analgesics, corticosteriod injections and activity modification.  Onset of symptoms was gradual, starting 1.5 years ago with gradually worsening course since that time. The patient noted no past surgery on the right knee. He has a history of a left unicompartmental knee replacement 2 years ago. Patient currently rates pain at 5 out of 10 with activity. Patient has worsening of pain with activity and weight bearing, pain that interferes with activities of daily living and crepitus.   Past Medical History:  Diagnosis Date  . Diabetes mellitus without complication (HCC)    TYPE 2   . Hypertension   . Primary localized osteoarthritis of left knee 02/10/2018   Past Surgical History:  Procedure Laterality Date  . CATARACT EXTRACTION W/ INTRAOCULAR LENS  IMPLANT, BILATERAL    . LEG SURGERY     vascular  . PARTIAL KNEE ARTHROPLASTY Left 02/10/2018   Procedure: LEFT UNICOMPARTMENTAL KNEE;  Surgeon: Teryl Lucy, MD;  Location: [facility] OR;  Service: Orthopedics;  Laterality: Left;   No Known Allergies Prior to Admission medications   Medication Sig Start Date End Date Taking? Authorizing Provider  atorvastatin (LIPITOR) 20 MG tablet Take 20 mg by mouth daily after supper. 01/07/18  Yes [provider]  Cyanocobalamin (B-12) 2500 MCG TABS Take 2,500 mcg by mouth daily.   Yes [provider]  gabapentin (NEURONTIN) 600 MG tablet Take 600 mg by mouth 3 (three) times daily.  11/15/17  Yes  [provider]  lisinopril-hydrochlorothiazide (PRINZIDE,ZESTORETIC) 20-25 MG tablet Take 1 tablet by mouth daily. 11/15/17  Yes [provider]  metFORMIN (GLUCOPHAGE) 500 MG tablet Take 500 mg by mouth 2 (two) times daily. 12/22/17  Yes [provider]  metoprolol tartrate (LOPRESSOR) 50 MG tablet Take 50 mg by mouth 2 (two) times daily. 12/22/17  Yes [provider]  Multiple Vitamin (MULTIVITAMIN WITH MINERALS) TABS tablet Take 1 tablet by mouth daily.   Yes [provider]  pyridOXINE (VITAMIN B-6) 100 MG tablet Take 100 mg by mouth daily.   Yes [provider]  aspirin EC 325 MG tablet Take 1 tablet (325 mg total) by mouth daily. Patient not taking: Reported on 04/21/2020 02/10/18   Teryl Lucy, MD  baclofen (LIORESAL) 10 MG tablet Take 1 tablet (10 mg total) by mouth 3 (three) times daily. As needed for muscle spasm Patient not taking: Reported on 04/21/2020 02/10/18   Teryl Lucy, MD  HYDROcodone-acetaminophen [facility]) 10-325 MG tablet Take 1 tablet by mouth every 6 (six) hours as needed. Patient not taking: Reported on 04/21/2020 02/10/18   Teryl Lucy, MD  meloxicam (MOBIC) 15 MG tablet Take 15 mg by mouth daily as needed for pain. Patient not taking: Reported on 04/21/2020 11/16/17   [provider]  ondansetron (ZOFRAN) 4 MG tablet Take 1 tablet (4 mg total) by mouth every 8 (eight) hours as needed for nausea or vomiting. Patient not taking: Reported on 04/21/2020 02/10/18   Teryl Lucy, MD  sennosides-docusate sodium (SENOKOT-S) 8.6-50 MG tablet Take 2 tablets by mouth daily. Patient  not taking: Reported on 04/21/2020 02/10/18   Teryl Lucy, MD   Social History   Socioeconomic History  . Marital status: Married    Spouse name: Not on file  . Number of children: Not on file  . Years of education: Not on file  . Highest education level: Not on file  Occupational History  . Not on file  Tobacco Use  . Smoking status:  Never Smoker  . Smokeless tobacco: Never Used  Vaping Use  . Vaping Use: Never used  Substance and Sexual Activity  . Alcohol use: Yes    Comment: occ  . Drug use: Never  . Sexual activity: Not on file  Other Topics Concern  . Not on file  Social History Narrative  . Not on file   Social Determinants of Health   Financial Resource Strain:   . Difficulty of Paying Living Expenses: Not on file  Food Insecurity:   . Worried About Programme researcher, broadcasting/film/video in the Last Year: Not on file  . Ran Out of Food in the Last Year: Not on file  Transportation Needs:   . Lack of Transportation (Medical): Not on file  . Lack of Transportation (Non-Medical): Not on file  Physical Activity:   . Days of Exercise per Week: Not on file  . Minutes of Exercise per Session: Not on file  Stress:   . Feeling of Stress : Not on file  Social Connections:   . Frequency of Communication with Friends and Family: Not on file  . Frequency of Social Gatherings with Friends and Family: Not on file  . Attends Religious Services: Not on file  . Active Member of Clubs or Organizations: Not on file  . Attends Banker Meetings: Not on file  . Marital Status: Not on file   No family history on file.  ROS: Currently denies lightheadedness, dizziness, Fever, chills, CP, SOB. No personal history of DVT, PE, MI, or CVA Patient does have dentures. All other systems have been reviewed and were otherwise currently negative with the exception of those mentioned in the HPI and as above.  Objective: Vitals: Ht: 5'7" Wt: 239 lbs Temp: 98.0 BP: 127/77 Pulse: 75 O2 96% on room air.   Physical Exam: General: Alert, NAD.  HEENT: EOMI, Good Neck Extension Pulm: No increased work of breathing.  Clear B/L A/P w/o crackle or wheeze.  CV: RRR, No m/g/r appreciated  GI: soft, NT, ND Neuro: Neuro without gross focal deficit.  Sensation intact distally Skin: No lesions in the area of chief complaint MSK/Surgical Site:  Right knee w/o redness or effusion.  Medial JLT. ROM 0-100 degrees.  +EHL/FHL. Stable varus and valgus stress.    Imaging Review Plain radiographs demonstrate severe anteromedial osteoarthritis of the right knee.    Assessment:   Plan: Plan for Procedure(s): UNICOMPARTMENTAL KNEE  The patient history, physical exam, clinical judgement of the provider and imaging are consistent with end stage degenerative joint disease and right knee unicompartmental arthroplasty is deemed medically necessary. The treatment options including medical management, injection therapy, and arthroplasty were discussed at length. The risks and benefits of Procedure(s): UNICOMPARTMENTAL KNEE were presented and reviewed.  The risks of nonoperative treatment, versus surgical intervention including but not limited to continued pain, aseptic loosening, stiffness, dislocation/subluxation, infection, bleeding, nerve injury, blood clots, cardiopulmonary complications, morbidity, mortality, among others were discussed. The patient verbalizes understanding and wishes to proceed with the plan.  Patient is being admitted for inpatient treatment for surgery,  pain control, PT, prophylactic antibiotics, VTE prophylaxis, progressive ambulation, ADL's and discharge planning.   Dental prophylaxis discussed and recommended for 2 years postoperatively.   The patient does meet the criteria for TXA which will be used perioperatively.    ASA 325 mg  will be used postoperatively for DVT prophylaxis in addition to SCDs, and early ambulation.  The patient is planning to be discharged home with HHPT ([facility]) in care of his wife   Annita Brod 04/28/2020 8:29 AM

## 2020-05-01 ENCOUNTER — Encounter: Payer: Self-pay | Admitting: Physical Therapy

## 2020-05-01 ENCOUNTER — Ambulatory Visit: Payer: Medicare Other | Admitting: Physical Therapy

## 2020-05-01 ENCOUNTER — Other Ambulatory Visit: Payer: Self-pay

## 2020-05-01 DIAGNOSIS — M79604 Pain in right leg: Secondary | ICD-10-CM

## 2020-05-01 DIAGNOSIS — R2689 Other abnormalities of gait and mobility: Secondary | ICD-10-CM

## 2020-05-01 NOTE — Therapy (Signed)
[facility] [address] [place], [place], [address] Phone: [phone]   Fax:  [phone]  Physical Therapy Treatment  Patient Details  Name: Derek Lara MRN: [id number] Date of Birth: Sep 20, 1950 Referring Provider (PT): Teryl Lucy, MD    Encounter Date: 05/01/2020   PT End of Session - 05/01/20 1634    Visit Number 6    Number of Visits 7    Date for PT Re-Evaluation 05/04/20    Authorization Type MCR: Kx mod at 15th visit    PT Start Time 1545    PT Stop Time 1628    PT Time Calculation (min) 43 min    Activity Tolerance Patient tolerated treatment well    Behavior During Therapy [facility] for tasks assessed/performed           Past Medical History:  Diagnosis Date  . Diabetes mellitus without complication (HCC)    TYPE 2   . Hypertension   . Primary localized osteoarthritis of left knee 02/10/2018    Past Surgical History:  Procedure Laterality Date  . CATARACT EXTRACTION W/ INTRAOCULAR LENS  IMPLANT, BILATERAL    . LEG SURGERY     vascular  . PARTIAL KNEE ARTHROPLASTY Left 02/10/2018   Procedure: LEFT UNICOMPARTMENTAL KNEE;  Surgeon: Teryl Lucy, MD;  Location: [facility] OR;  Service: Orthopedics;  Laterality: Left;    There were no vitals filed for this visit.   Subjective Assessment - 05/01/20 1619    Subjective Patient reports the pain in his ankle has improved significantly he feles like it is only 10% limited. He does continue to have pain in his gorin/abbductors at times. His urgery is shceudled for Tuesday.    How long can you sit comfortably? unlimited    How long can you stand comfortably? 30 min    Diagnostic tests N/A    Patient Stated Goals decrease knee, hip and calf pain    Currently in Pain? Yes    Pain Score 3     Pain Location Ankle    Pain Orientation Right;Medial    Pain Descriptors / Indicators Aching    Pain Type Chronic pain    Pain Onset More than a month ago    Pain Frequency  Intermittent    Aggravating Factors  standing and walking    Pain Relieving Factors ice/ exercises/ stretching    Effect of Pain on Daily Activities pain with driving                             [facility] Adult PT Treatment/Exercise - 05/01/20 0001      Knee/Hip Exercises: Stretches   Active Hamstring Stretch 2 reps;30 seconds;Right      Knee/Hip Exercises: Aerobic   Nustep L1 x 5 min LE only      Knee/Hip Exercises: Standing   Hip Abduction Stengthening;Both;2 sets;10 reps;Knee straight      Knee/Hip Exercises: Seated   Other Seated Knee/Hip Exercises clamshell red band 2x10       Manual Therapy   Joint Mobilization grade IV talocrural distraction mobs, and PA mobs to promote PF grade IV    Soft tissue mobilization IASTM along achilles with XF techniques    Passive ROM passive groin stretch       Ankle Exercises: Stretches   Gastroc Stretch 2 reps;30 seconds   with strap sitting     Ankle Exercises: Standing   Other Standing Ankle Exercises eccentric calf  strengthening in sitting 2  x 10 with 3 second eccentric lengthing      Ankle Exercises: Seated   Other Seated Ankle Exercises rocker board forward and back x20                   PT Education - 05/01/20 1628    Education Details reviewed technique with stretching and exercises    Person(s) Educated Patient    Methods Explanation;Tactile cues;Demonstration;Verbal cues    Comprehension Verbalized understanding;Returned demonstration;Verbal cues required            PT Short Term Goals - 04/27/20 1558      PT SHORT TERM GOAL #1   Title pt to be I with inital HEP    Period Weeks    Status Achieved             PT Long Term Goals - 04/27/20 1601      PT LONG TERM GOAL #1   Title pt to report </= 2/10 pain in the R hip adductors and gastroc/ soleus to promote improvement in condition    Period Weeks    Status On-going      PT LONG TERM GOAL #2   Title pt to be able to sit stand and  walk for >/= 1 hour with no limtations and </= 2/10 pain for functional endurnace required for community amb.    Baseline limited walking standing more due to the knee    Period Weeks    Status On-going      PT LONG TERM GOAL #3   Title increase FOTO score to >/=79% to demo improvement in function    Period Weeks    Status On-going      PT LONG TERM GOAL #4   Title pt to be IND with all HEP given and is able to maintain and progress current LOF IND.    Period Weeks    Status On-going                 Plan - 05/01/20 1634    Clinical Impression Statement Patient continues to have increased groin pain when we stretch his calf. It is unclear what is causing this. He tolerated all ther-ex well today except LTR. He reported increased groin pain with LTR. The exercise was stopped. He was advised not to get his knee too flaired up over the nextr few days because he is havinghis surgery next week.    Personal Factors and Comorbidities Comorbidity 1    Comorbidities hx of DM    Examination-Activity Limitations Transfers    Stability/Clinical Decision Making Evolving/Moderate complexity    Clinical Decision Making Moderate    Rehab Potential Poor    PT Frequency 2x / week    PT Duration 2 weeks    PT Treatment/Interventions ADLs/Self Care Home Management;Cryotherapy;Electrical Stimulation;Iontophoresis 4mg /ml Dexamethasone;Moist Heat;Ultrasound;Therapeutic exercise;Gait training;Stair training;Therapeutic activities;Patient/family education;Manual techniques;Neuromuscular re-education;Taping;Dry needling;Passive range of motion    PT Next Visit Plan continue to progress exercises. Consider adding ankle exercises to home program. progress standing exercises if knee allows, calf eccentrics sitting vs standing, gait training    PT Home Exercise Plan - seated adductor stretch, hamstring stretch (supine/ seated), gastroc/ soleus stretch. heel/ toe raise, towel IR/ER    Consulted and  Agree with Plan of Care Patient           Patient will benefit from skilled therapeutic intervention in order to improve the following deficits and impairments:  Improper body mechanics,  Increased muscle spasms, Decreased strength, Pain, Decreased endurance, Decreased activity tolerance, Decreased range of motion  Visit Diagnosis: Pain in right leg  Other abnormalities of gait and mobility     Problem List Patient Active Problem List   Diagnosis Date Noted  . Primary localized osteoarthritis of left knee 02/10/2018  . S/P left unicompartmental knee replacement 02/10/2018    Dessie Coma PT DPT 05/01/2020, 4:59 PM  [facility] [address] [place], [place], [address] Phone: [phone]   Fax:  [phone]  Name: Rossi Burdo MRN: [id number] Date of Birth: 10/10/1950

## 2020-05-04 ENCOUNTER — Other Ambulatory Visit: Payer: Self-pay

## 2020-05-04 ENCOUNTER — Encounter: Payer: Self-pay | Admitting: Physical Therapy

## 2020-05-04 ENCOUNTER — Ambulatory Visit: Payer: Medicare Other | Admitting: Physical Therapy

## 2020-05-04 DIAGNOSIS — M79604 Pain in right leg: Secondary | ICD-10-CM

## 2020-05-04 DIAGNOSIS — R2689 Other abnormalities of gait and mobility: Secondary | ICD-10-CM

## 2020-05-04 NOTE — Therapy (Addendum)
[place], [place], [address] Phone: [phone]   Fax:  [phone]  Physical Therapy Treatment/Discharge   Patient Details  Name: Derek Lara MRN: [id number] Date of Birth: 08/09/1950 Referring Provider (PT): Marchia Bond, MD    Encounter Date: 05/04/2020   PT End of Session - 05/04/20 1553    Visit Number 7    Number of Visits 7    Date for PT Re-Evaluation 05/04/20    Authorization Type MCR: Kx mod at 15th visit    Authorization Time Period FOTO at  10th    PT Start Time 1545    PT Stop Time 1623    PT Time Calculation (min) 38 min    Activity Tolerance Patient tolerated treatment well    Behavior During Therapy [facility] for tasks assessed/performed           Past Medical History:  Diagnosis Date  . Diabetes mellitus without complication ([place])    TYPE 2   . Hypertension   . Primary localized osteoarthritis of left knee 02/10/2018    Past Surgical History:  Procedure Laterality Date  . CATARACT EXTRACTION W/ INTRAOCULAR LENS  IMPLANT, BILATERAL    . LEG SURGERY     vascular  . PARTIAL KNEE ARTHROPLASTY Left 02/10/2018   Procedure: LEFT UNICOMPARTMENTAL KNEE;  Surgeon: Marchia Bond, MD;  Location: [place];  Service: Orthopedics;  Laterality: Left;    There were no vitals filed for this visit.   Subjective Assessment - 05/04/20 1552    Subjective Patient reports his ankle pain has improved. He continues to have some groinpain but it is not as bad as his knee. His knee is hurting today.    How long can you sit comfortably? unlimited    How long can you stand comfortably? 30 min    How long can you walk comfortably? 30 min    Diagnostic tests N/A    Patient Stated Goals decrease knee, hip and calf pain    Currently in Pain? Yes    Pain Score 5     Pain Location Knee    Pain Orientation Right    Pain Descriptors / Indicators Aching    Pain Type Chronic pain    Pain Onset More than  a month ago    Pain Frequency Intermittent    Aggravating Factors  standing and walking    Pain Relieving Factors ice/ exercissing/ stretching    Effect of Pain on Daily Activities pain with driving                             [facility] Adult PT Treatment/Exercise - 05/04/20 0001      Self-Care   Self-Care Other Self-Care Comments    Other Self-Care Comments  reviewed stretching and light exercises prior to surgery. After surgery patient advised to continue with HEP       Knee/Hip Exercises: Stretches   Active Hamstring Stretch 2 reps;30 seconds;Right      Knee/Hip Exercises: Aerobic   Nustep L1 x 5 min LE only      Knee/Hip Exercises: Seated   Other Seated Knee/Hip Exercises heel raises 2x10     Other Seated Knee/Hip Exercises clamshell red band 2x10     Marching Limitations 2x10       Knee/Hip Exercises: Supine   Quad Sets Limitations 3x10       Ankle Exercises: Stretches   Press photographer 2  reps;30 seconds   with strap sitting     Ankle Exercises: Supine   Other Supine Ankle Exercises ankle DF and PF 2x10 each red band                   PT Education - 05/04/20 1553    Education Details HEP and symptom mangement    Person(s) Educated Patient    Methods Explanation;Demonstration;Tactile cues;Verbal cues    Comprehension Verbalized understanding;Returned demonstration;Verbal cues required;Tactile cues required            PT Short Term Goals - 05/04/20 2047      PT SHORT TERM GOAL #1   Title pt to be I with inital HEP    Time 2    Status Achieved    Target Date 04/19/20             PT Long Term Goals - 04/27/20 1601      PT LONG TERM GOAL #1   Title pt to report </= 2/10 pain in the R hip adductors and gastroc/ soleus to promote improvement in condition    Period Weeks    Status On-going      PT LONG TERM GOAL #2   Title pt to be able to sit stand and walk for >/= 1 hour with no limtations and </= 2/10 pain for functional  endurnace required for community amb.    Baseline limited walking standing more due to the knee    Period Weeks    Status On-going      PT LONG TERM GOAL #3   Title increase FOTO score to >/=79% to demo improvement in function    Period Weeks    Status On-going      PT LONG TERM GOAL #4   Title pt to be IND with all HEP given and is able to maintain and progress current LOF IND.    Period Weeks    Status On-going                 Plan - 05/04/20 1554    Clinical Impression Statement Patient will have right UKA on 1123/2021. His right ankle pain has resolved. He continues to hav eintermittent groin pain but that has improved as well. His C/O is pain in the knee. Therapy will D/C him at this time to HEP. He may return after his surgery per MD. Therapy reviewed light stretching and exercises for the ankle and groin area    Comorbidities hx of DM    Examination-Activity Limitations Transfers    Stability/Clinical Decision Making Evolving/Moderate complexity    Clinical Decision Making Moderate    Rehab Potential Poor    PT Frequency 2x / week    PT Duration 2 weeks    PT Treatment/Interventions ADLs/Self Care Home Management;Cryotherapy;Electrical Stimulation;Iontophoresis 68m/ml Dexamethasone;Moist Heat;Ultrasound;Therapeutic exercise;Gait training;Stair training;Therapeutic activities;Patient/family education;Manual techniques;Neuromuscular re-education;Taping;Dry needling;Passive range of motion    PT Next Visit Plan continue to progress exercises. Consider adding ankle exercises to home program. progress standing exercises if knee allows, calf eccentrics sitting vs standing, gait training    PT Home Exercise Plan [id number]- seated adductor stretch, hamstring stretch (supine/ seated), gastroc/ soleus stretch. heel/ toe raise, towel IR/ER    Consulted and Agree with Plan of Care Patient           Patient will benefit from skilled therapeutic intervention in order to improve the  following deficits and impairments:  Improper body mechanics, Increased muscle spasms, Decreased strength, Pain,  Decreased endurance, Decreased activity tolerance, Decreased range of motion  Visit Diagnosis: Pain in right leg  Other abnormalities of gait and mobility  PHYSICAL THERAPY DISCHARGE SUMMARY  Visits from Start of Care: 7  Current functional level related to goals / functional outcomes: Improved groin and lower leg pain  Remaining deficits: Pain in the knee but being replaced   Education / Equipment: HEP   Plan: Patient agrees to discharge.  Patient goals were met. Patient is being discharged due to meeting the stated rehab goals.  ?????       Problem List Patient Active Problem List   Diagnosis Date Noted  . Primary localized osteoarthritis of left knee 02/10/2018  . S/P left unicompartmental knee replacement 02/10/2018    Carney Living PT DPT  05/04/2020, 8:49 PM  [facility] [address] [place], [place], [address] Phone: [phone]   Fax:  [phone]  Name: Derek Lara MRN: [id number] Date of Birth: [birth year]

## 2020-05-05 ENCOUNTER — Other Ambulatory Visit (HOSPITAL_COMMUNITY)
Admission: RE | Admit: 2020-05-05 | Discharge: 2020-05-05 | Disposition: A | Discharge status: Home / Self Care | Payer: Medicare Other | Source: Ambulatory Visit | Attending: Orthopedic Surgery | Admitting: Orthopedic Surgery

## 2020-05-05 DIAGNOSIS — Z01812 Encounter for preprocedural laboratory examination: Secondary | ICD-10-CM | POA: Diagnosis present

## 2020-05-05 DIAGNOSIS — Z20822 Contact with and (suspected) exposure to covid-19: Secondary | ICD-10-CM | POA: Diagnosis not present

## 2020-05-05 LAB — SARS CORONAVIRUS 2 (TAT 6-24 HRS): SARS Coronavirus 2: NEGATIVE

## 2020-05-08 NOTE — Progress Notes (Signed)
Called patient about time change for surgery on 05/09/20. He is to arrive 0640 for 0910 surgery on 05/09/20. Finish G2 drink before 0600. Take medications at 0600. NPO after 0600. He verbalizes understanding.

## 2020-05-08 NOTE — Anesthesia Preprocedure Evaluation (Addendum)
Anesthesia Evaluation  Patient identified by MRN, date of birth, ID band Patient awake    Reviewed: Allergy & Precautions, H&P , NPO status , Patient's Chart, lab work & pertinent test results  Airway Mallampati: I  TM Distance: >3 FB Neck ROM: Full    Dental no notable dental hx. (+) Edentulous Upper, Edentulous Lower   Pulmonary neg pulmonary ROS,    Pulmonary exam normal breath sounds clear to auscultation       Cardiovascular Exercise Tolerance: Good hypertension, Pt. on medications and Pt. on home beta blockers negative cardio ROS Normal cardiovascular exam Rhythm:Regular Rate:Normal     Neuro/Psych negative neurological ROS  negative psych ROS   GI/Hepatic negative GI ROS, Neg liver ROS,   Endo/Other  negative endocrine ROSdiabetes, Type 2  Renal/GU negative Renal ROS  negative genitourinary   Musculoskeletal negative musculoskeletal ROS (+) Arthritis , Osteoarthritis,    Abdominal   Peds negative pediatric ROS (+)  Hematology negative hematology ROS (+)   Anesthesia Other Findings   Reproductive/Obstetrics negative OB ROS                            Anesthesia Physical Anesthesia Plan  ASA: III  Anesthesia Plan: MAC and Spinal   Post-op Pain Management:  Regional for Post-op pain   Induction:   PONV Risk Score and Plan: 2 and Treatment may vary due to age or medical condition and Midazolam  Airway Management Planned: Natural Airway, Nasal Cannula, Simple Face Mask and Mask  Additional Equipment:   Intra-op Plan:   Post-operative Plan:   Informed Consent: I have reviewed the patients History and Physical, chart, labs and discussed the procedure including the risks, benefits and alternatives for the proposed anesthesia with the patient or authorized representative who has indicated his/her understanding and acceptance.       Plan Discussed with:  Anesthesiologist  Anesthesia Plan Comments: (  )       Anesthesia Quick Evaluation

## 2020-05-09 ENCOUNTER — Encounter (HOSPITAL_COMMUNITY): Payer: Self-pay | Admitting: Orthopedic Surgery

## 2020-05-09 ENCOUNTER — Encounter (HOSPITAL_COMMUNITY)
Admission: RE | Disposition: A | Discharge status: Home / Self Care | Payer: Self-pay | Source: Other Acute Inpatient Hospital | Attending: Orthopedic Surgery

## 2020-05-09 ENCOUNTER — Ambulatory Visit (HOSPITAL_COMMUNITY)
Admission: RE | Admit: 2020-05-09 | Discharge: 2020-05-09 | Disposition: A | Discharge status: Home / Self Care | Payer: Medicare Other | Source: Other Acute Inpatient Hospital | Attending: Orthopedic Surgery | Admitting: Orthopedic Surgery

## 2020-05-09 ENCOUNTER — Ambulatory Visit (HOSPITAL_COMMUNITY): Payer: Medicare Other | Admitting: Certified Registered Nurse Anesthetist

## 2020-05-09 ENCOUNTER — Ambulatory Visit (HOSPITAL_COMMUNITY): Payer: Medicare Other | Admitting: Physician Assistant

## 2020-05-09 ENCOUNTER — Ambulatory Visit (HOSPITAL_COMMUNITY): Payer: Medicare Other

## 2020-05-09 DIAGNOSIS — Z96651 Presence of right artificial knee joint: Secondary | ICD-10-CM

## 2020-05-09 DIAGNOSIS — Z7984 Long term (current) use of oral hypoglycemic drugs: Secondary | ICD-10-CM | POA: Insufficient documentation

## 2020-05-09 DIAGNOSIS — Z7982 Long term (current) use of aspirin: Secondary | ICD-10-CM | POA: Diagnosis not present

## 2020-05-09 DIAGNOSIS — M1711 Unilateral primary osteoarthritis, right knee: Secondary | ICD-10-CM | POA: Diagnosis not present

## 2020-05-09 DIAGNOSIS — Z79899 Other long term (current) drug therapy: Secondary | ICD-10-CM | POA: Insufficient documentation

## 2020-05-09 DIAGNOSIS — M6281 Muscle weakness (generalized): Secondary | ICD-10-CM | POA: Insufficient documentation

## 2020-05-09 DIAGNOSIS — R262 Difficulty in walking, not elsewhere classified: Secondary | ICD-10-CM | POA: Diagnosis not present

## 2020-05-09 HISTORY — PX: PARTIAL KNEE ARTHROPLASTY: SHX2174

## 2020-05-09 LAB — HEMOGLOBIN A1C
Hgb A1c MFr Bld: 6.2 % — ABNORMAL HIGH (ref 4.8–5.6)
Mean Plasma Glucose: 131.24 mg/dL

## 2020-05-09 LAB — GLUCOSE, CAPILLARY
Glucose-Capillary: 122 mg/dL — ABNORMAL HIGH (ref 70–99)
Glucose-Capillary: 117 mg/dL — ABNORMAL HIGH (ref 70–99)

## 2020-05-09 SURGERY — ARTHROPLASTY, KNEE, UNICOMPARTMENTAL
Anesthesia: Monitor Anesthesia Care | Site: Knee | Laterality: Right

## 2020-05-09 MED ORDER — LACTATED RINGERS IV BOLUS
500.0000 mL | Freq: Once | INTRAVENOUS | Status: AC
  Administered 2020-05-09: 500 mL via INTRAVENOUS

## 2020-05-09 MED ORDER — TRANEXAMIC ACID-NACL 1000-0.7 MG/100ML-% IV SOLN
1000.0000 mg | INTRAVENOUS | Status: AC
  Administered 2020-05-09: 1000 mg via INTRAVENOUS
  Filled 2020-05-09: qty 100

## 2020-05-09 MED ORDER — KETOROLAC TROMETHAMINE 30 MG/ML IJ SOLN
INTRAMUSCULAR | Status: AC
  Filled 2020-05-09: qty 1

## 2020-05-09 MED ORDER — FENTANYL CITRATE (PF) 100 MCG/2ML IJ SOLN
50.0000 ug | INTRAMUSCULAR | Status: DC
  Administered 2020-05-09: 50 ug via INTRAVENOUS
  Filled 2020-05-09: qty 2

## 2020-05-09 MED ORDER — ACETAMINOPHEN 325 MG PO TABS: 325.0000 mg | ORAL_TABLET | ORAL | Status: DC | PRN

## 2020-05-09 MED ORDER — BUPIVACAINE HCL 0.25 % IJ SOLN
INTRAMUSCULAR | Status: DC | PRN
  Administered 2020-05-09: 30 mL via INTRA_ARTICULAR

## 2020-05-09 MED ORDER — STERILE WATER FOR IRRIGATION IR SOLN
Status: DC | PRN
  Administered 2020-05-09: 2000 mL

## 2020-05-09 MED ORDER — OXYCODONE HCL 5 MG/5ML PO SOLN: 5.0000 mg | Freq: Once | ORAL | Status: DC | PRN

## 2020-05-09 MED ORDER — LACTATED RINGERS IV SOLN: INTRAVENOUS | Status: DC

## 2020-05-09 MED ORDER — KETOROLAC TROMETHAMINE 30 MG/ML IJ SOLN
INTRAMUSCULAR | Status: DC | PRN
  Administered 2020-05-09: 30 mg via INTRA_ARTICULAR

## 2020-05-09 MED ORDER — PROPOFOL 1000 MG/100ML IV EMUL
INTRAVENOUS | Status: AC
  Filled 2020-05-09: qty 100

## 2020-05-09 MED ORDER — ONDANSETRON HCL 4 MG/2ML IJ SOLN: 4.0000 mg | Freq: Once | INTRAMUSCULAR | Status: DC | PRN

## 2020-05-09 MED ORDER — SENNA-DOCUSATE SODIUM 8.6-50 MG PO TABS: 2.0000 | ORAL_TABLET | Freq: Every day | ORAL | 1 refills | Status: AC

## 2020-05-09 MED ORDER — PHENYLEPHRINE HCL (PRESSORS) 10 MG/ML IV SOLN
INTRAVENOUS | Status: AC
  Filled 2020-05-09: qty 1

## 2020-05-09 MED ORDER — ONDANSETRON HCL 4 MG/2ML IJ SOLN
INTRAMUSCULAR | Status: AC
  Filled 2020-05-09: qty 2

## 2020-05-09 MED ORDER — BACLOFEN 10 MG PO TABS: 10.0000 mg | ORAL_TABLET | Freq: Three times a day (TID) | ORAL | 0 refills | Status: AC

## 2020-05-09 MED ORDER — METHOCARBAMOL 500 MG IVPB - SIMPLE MED: 500.0000 mg | Freq: Four times a day (QID) | INTRAVENOUS | Status: DC | PRN

## 2020-05-09 MED ORDER — ONDANSETRON HCL 4 MG/2ML IJ SOLN
INTRAMUSCULAR | Status: DC | PRN
  Administered 2020-05-09: 4 mg via INTRAVENOUS

## 2020-05-09 MED ORDER — LACTATED RINGERS IV BOLUS
250.0000 mL | Freq: Once | INTRAVENOUS | Status: AC
  Administered 2020-05-09: 250 mL via INTRAVENOUS

## 2020-05-09 MED ORDER — PROPOFOL 500 MG/50ML IV EMUL
INTRAVENOUS | Status: AC
  Filled 2020-05-09: qty 50

## 2020-05-09 MED ORDER — METHOCARBAMOL 500 MG PO TABS: 500.0000 mg | ORAL_TABLET | Freq: Four times a day (QID) | ORAL | Status: DC | PRN

## 2020-05-09 MED ORDER — ONDANSETRON HCL 4 MG PO TABS: 4.0000 mg | ORAL_TABLET | Freq: Three times a day (TID) | ORAL | 0 refills | Status: AC | PRN

## 2020-05-09 MED ORDER — CEFAZOLIN SODIUM-DEXTROSE 2-4 GM/100ML-% IV SOLN: 2.0000 g | Freq: Four times a day (QID) | INTRAVENOUS | Status: DC

## 2020-05-09 MED ORDER — CHLORHEXIDINE GLUCONATE 0.12 % MT SOLN
15.0000 mL | Freq: Once | OROMUCOSAL | Status: AC
  Administered 2020-05-09: 15 mL via OROMUCOSAL

## 2020-05-09 MED ORDER — ACETAMINOPHEN 160 MG/5ML PO SOLN: 325.0000 mg | ORAL | Status: DC | PRN

## 2020-05-09 MED ORDER — MIDAZOLAM HCL 2 MG/2ML IJ SOLN
1.0000 mg | INTRAMUSCULAR | Status: DC
  Administered 2020-05-09: 2 mg via INTRAVENOUS
  Filled 2020-05-09: qty 2

## 2020-05-09 MED ORDER — BUPIVACAINE HCL 0.25 % IJ SOLN
INTRAMUSCULAR | Status: AC
  Filled 2020-05-09: qty 1

## 2020-05-09 MED ORDER — SODIUM CHLORIDE 0.9 % IR SOLN
Status: DC | PRN
  Administered 2020-05-09: 1000 mL

## 2020-05-09 MED ORDER — PROPOFOL 10 MG/ML IV BOLUS
INTRAVENOUS | Status: AC
  Filled 2020-05-09: qty 20

## 2020-05-09 MED ORDER — ORAL CARE MOUTH RINSE: 15.0000 mL | Freq: Once | OROMUCOSAL | Status: AC

## 2020-05-09 MED ORDER — ROPIVACAINE HCL 7.5 MG/ML IJ SOLN
INTRAMUSCULAR | Status: DC | PRN
  Administered 2020-05-09: 25 mL via PERINEURAL

## 2020-05-09 MED ORDER — MEPERIDINE HCL 50 MG/ML IJ SOLN: 6.2500 mg | INTRAMUSCULAR | Status: DC | PRN

## 2020-05-09 MED ORDER — HYDROCODONE-ACETAMINOPHEN 10-325 MG PO TABS: 1.0000 | ORAL_TABLET | Freq: Four times a day (QID) | ORAL | 0 refills | Status: AC | PRN

## 2020-05-09 MED ORDER — CEFAZOLIN SODIUM-DEXTROSE 2-4 GM/100ML-% IV SOLN
2.0000 g | INTRAVENOUS | Status: AC
  Administered 2020-05-09: 2 g via INTRAVENOUS
  Filled 2020-05-09: qty 100

## 2020-05-09 MED ORDER — ASPIRIN EC 325 MG PO TBEC: 325.0000 mg | DELAYED_RELEASE_TABLET | Freq: Two times a day (BID) | ORAL | 0 refills | Status: AC

## 2020-05-09 MED ORDER — PHENYLEPHRINE HCL-NACL 10-0.9 MG/250ML-% IV SOLN
INTRAVENOUS | Status: DC | PRN
  Administered 2020-05-09: 40 ug/min via INTRAVENOUS

## 2020-05-09 MED ORDER — BUPIVACAINE IN DEXTROSE 0.75-8.25 % IT SOLN
INTRATHECAL | Status: DC | PRN
  Administered 2020-05-09: 1.6 mL via INTRATHECAL

## 2020-05-09 MED ORDER — 0.9 % SODIUM CHLORIDE (POUR BTL) OPTIME
TOPICAL | Status: DC | PRN
  Administered 2020-05-09: 1000 mL

## 2020-05-09 MED ORDER — PROPOFOL 10 MG/ML IV BOLUS
INTRAVENOUS | Status: DC | PRN
  Administered 2020-05-09 (×4): 20 mg via INTRAVENOUS
  Administered 2020-05-09: 10 mg via INTRAVENOUS

## 2020-05-09 MED ORDER — POVIDONE-IODINE 10 % EX SWAB
2.0000 "application " | Freq: Once | CUTANEOUS | Status: AC
  Administered 2020-05-09: 2 via TOPICAL

## 2020-05-09 MED ORDER — PROPOFOL 500 MG/50ML IV EMUL
INTRAVENOUS | Status: DC | PRN
  Administered 2020-05-09: 50 ug/kg/min via INTRAVENOUS

## 2020-05-09 MED ORDER — FENTANYL CITRATE (PF) 100 MCG/2ML IJ SOLN: 25.0000 ug | INTRAMUSCULAR | Status: DC | PRN

## 2020-05-09 MED ORDER — ACETAMINOPHEN 500 MG PO TABS
1000.0000 mg | ORAL_TABLET | Freq: Once | ORAL | Status: AC
  Administered 2020-05-09: 1000 mg via ORAL
  Filled 2020-05-09: qty 2

## 2020-05-09 MED ORDER — DEXAMETHASONE SODIUM PHOSPHATE 10 MG/ML IJ SOLN
INTRAMUSCULAR | Status: DC | PRN
  Administered 2020-05-09: 10 mg via INTRAVENOUS

## 2020-05-09 MED ORDER — OXYCODONE HCL 5 MG PO TABS: 5.0000 mg | ORAL_TABLET | Freq: Once | ORAL | Status: DC | PRN

## 2020-05-09 SURGICAL SUPPLY — 66 items
BAG ZIPLOCK 12X15 (MISCELLANEOUS) ×3 IMPLANT
BANDAGE ESMARK 6X9 LF (GAUZE/BANDAGES/DRESSINGS) ×1 IMPLANT
BIT DRILL QUICK REL 1/8 2PK SL (DRILL) ×1 IMPLANT
BLADE SURG 15 STRL LF DISP TIS (BLADE) ×1 IMPLANT
BLADE SURG 15 STRL SS (BLADE) ×3
BNDG ELASTIC 6X15 VLCR STRL LF (GAUZE/BANDAGES/DRESSINGS) ×3 IMPLANT
BNDG ESMARK 6X9 LF (GAUZE/BANDAGES/DRESSINGS) ×3
BOWL SMART MIX CTS (DISPOSABLE) ×3 IMPLANT
CEMENT BONE R 1X40 (Cement) ×3 IMPLANT
CLOSURE STERI-STRIP 1/2X4 (GAUZE/BANDAGES/DRESSINGS) ×1
CLSR STERI-STRIP ANTIMIC 1/2X4 (GAUZE/BANDAGES/DRESSINGS) ×2 IMPLANT
COVER SURGICAL LIGHT HANDLE (MISCELLANEOUS) ×3 IMPLANT
COVER WAND RF STERILE (DRAPES) IMPLANT
CUFF TOURN SGL QUICK 34 (TOURNIQUET CUFF) ×3
CUFF TRNQT CYL 34X4.125X (TOURNIQUET CUFF) ×1 IMPLANT
DECANTER SPIKE VIAL GLASS SM (MISCELLANEOUS) IMPLANT
DRAPE EXTREMITY T 121X128X90 (DISPOSABLE) ×3 IMPLANT
DRAPE POUCH INSTRU U-SHP 10X18 (DRAPES) ×3 IMPLANT
DRAPE SHEET LG 3/4 BI-LAMINATE (DRAPES) ×3 IMPLANT
DRAPE U-SHAPE 47X51 STRL (DRAPES) ×3 IMPLANT
DRILL QUICK RELEASE 1/8 INCH (DRILL) ×3
DRSG MEPILEX BORDER 4X8 (GAUZE/BANDAGES/DRESSINGS) ×3 IMPLANT
DRSG PAD ABDOMINAL 8X10 ST (GAUZE/BANDAGES/DRESSINGS) ×3 IMPLANT
DURAPREP 26ML APPLICATOR (WOUND CARE) ×6 IMPLANT
ELECT REM PT RETURN 15FT ADLT (MISCELLANEOUS) ×3 IMPLANT
FACESHIELD WRAPAROUND (MASK) ×3 IMPLANT
GLOVE BIO SURGEON STRL SZ7 (GLOVE) ×3 IMPLANT
GLOVE BIOGEL PI IND STRL 7.0 (GLOVE) ×1 IMPLANT
GLOVE BIOGEL PI IND STRL 8 (GLOVE) ×1 IMPLANT
GLOVE BIOGEL PI INDICATOR 7.0 (GLOVE) ×2
GLOVE BIOGEL PI INDICATOR 8 (GLOVE) ×2
GLOVE SURG SS PI 7.5 STRL IVOR (GLOVE) ×3 IMPLANT
GOWN STRL REUS W/TWL LRG LVL3 (GOWN DISPOSABLE) ×6 IMPLANT
HANDPIECE INTERPULSE COAX TIP (DISPOSABLE) ×3
HOLDER FOLEY CATH W/STRAP (MISCELLANEOUS) IMPLANT
HOOD PEEL AWAY FLYTE STAYCOOL (MISCELLANEOUS) ×6 IMPLANT
IMMOBILIZER KNEE 20 (SOFTGOODS) ×3
IMMOBILIZER KNEE 20 THIGH 36 (SOFTGOODS) ×1 IMPLANT
IMMOBILIZER KNEE 22 UNIV (SOFTGOODS) IMPLANT
INSERT TIB BEARING SZ 7 RT (Insert) ×3 IMPLANT
KIT BASIN OR (CUSTOM PROCEDURE TRAY) ×3 IMPLANT
KIT TURNOVER KIT A (KITS) IMPLANT
NDL SAFETY ECLIPSE 18X1.5 (NEEDLE) ×1 IMPLANT
NEEDLE HYPO 18GX1.5 SHARP (NEEDLE) ×3
NS IRRIG 1000ML POUR BTL (IV SOLUTION) ×3 IMPLANT
PACK BLADE SAW RECIP 70 3 PT (BLADE) ×3 IMPLANT
PACK ICE MAXI GEL EZY WRAP (MISCELLANEOUS) ×3 IMPLANT
PACK TOTAL JOINT (CUSTOM PROCEDURE TRAY) ×3 IMPLANT
PEG FEMORAL CEMENT STRL LRG (Knees) ×3 IMPLANT
PENCIL SMOKE EVACUATOR (MISCELLANEOUS) IMPLANT
PROTECTOR NERVE ULNAR (MISCELLANEOUS) ×3 IMPLANT
SET HNDPC FAN SPRY TIP SCT (DISPOSABLE) ×1 IMPLANT
SUCTION FRAZIER HANDLE 12FR (TUBING) ×3
SUCTION TUBE FRAZIER 12FR DISP (TUBING) ×1 IMPLANT
SUT VIC AB 1 CT1 36 (SUTURE) ×3 IMPLANT
SUT VIC AB 2-0 CT1 27 (SUTURE) ×3
SUT VIC AB 2-0 CT1 TAPERPNT 27 (SUTURE) ×1 IMPLANT
SUT VIC AB 3-0 SH 8-18 (SUTURE) ×3 IMPLANT
SYR 30ML LL (SYRINGE) ×3 IMPLANT
SYR 3ML LL SCALE MARK (SYRINGE) ×3 IMPLANT
TOWEL OR 17X26 10 PK STRL BLUE (TOWEL DISPOSABLE) ×3 IMPLANT
TOWEL OR NON WOVEN STRL DISP B (DISPOSABLE) ×3 IMPLANT
TRAY FOLEY MTR SLVR 16FR STAT (SET/KITS/TRAYS/PACK) ×3 IMPLANT
TRAY TIBIAL OXFORD SZ C RT (Joint) ×3 IMPLANT
WATER STERILE IRR 1000ML POUR (IV SOLUTION) ×3 IMPLANT
WRAP KNEE MAXI GEL POST OP (GAUZE/BANDAGES/DRESSINGS) ×3 IMPLANT

## 2020-05-09 NOTE — Interval H&P Note (Signed)
History and Physical Interval Note:  05/09/2020 7:12 AM  Derek Lara  has presented today for surgery, with the diagnosis of OA RIGHT KNEE.  The various methods of treatment have been discussed with the patient and family. After consideration of risks, benefits and other options for treatment, the patient has consented to  Procedure(s): UNICOMPARTMENTAL KNEE (Right) as a surgical intervention.  The patient's history has been reviewed, patient examined, no change in status, stable for surgery.  I have reviewed the patient's chart and labs.  Questions were answered to the patient's satisfaction.     Eulas Post

## 2020-05-09 NOTE — Transfer of Care (Signed)
Immediate Anesthesia Transfer of Care Note  Patient: Derek Lara  Procedure(s) Performed: UNICOMPARTMENTAL KNEE (Right Knee)  Patient Location: PACU  Anesthesia Type:Spinal  Level of Consciousness: drowsy and patient cooperative  Airway & Oxygen Therapy: Patient Spontanous Breathing and Patient connected to face mask oxygen  Post-op Assessment: Report given to RN and Post -op Vital signs reviewed and stable  Post vital signs: Reviewed and stable  Last Vitals:  Vitals Value Taken Time  BP 104/68 05/09/20 1154  Temp    Pulse 55 05/09/20 1158  Resp 12 05/09/20 1158  SpO2 100 % 05/09/20 1158  Vitals shown include unvalidated device data.  Last Pain:  Vitals:   05/09/20 1154  TempSrc:   PainSc: (P) Asleep      Patients Stated Pain Goal: 3 (05/09/20 8185)  Complications: No complications documented.

## 2020-05-09 NOTE — Anesthesia Procedure Notes (Signed)
Anesthesia Regional Block: Adductor canal block   Pre-Anesthetic Checklist: ,, timeout performed, Correct Patient, Correct Site, Correct Laterality, Correct Procedure, Correct Position, site marked, Risks and benefits discussed,  Surgical consent,  Pre-op evaluation,  At surgeon's request and post-op pain management  Laterality: Right  Prep: chloraprep       Needles:  Injection technique: Single-shot  Needle Type: Echogenic Stimulator Needle     Needle Length: 5cm  Needle Gauge: 22     Additional Needles:   Procedures:, nerve stimulator,,, ultrasound used (permanent image in chart),,,,  Narrative:  Start time: 05/09/2020 8:27 AM End time: 05/09/2020 8:30 AM Injection made incrementally with aspirations every 5 mL.  Performed by: Personally  Anesthesiologist: Bethena Midget, MD  Additional Notes: Functioning IV was confirmed and monitors were applied.  A 38mm 22ga Arrow echogenic stimulator needle was used. Sterile prep and drape,hand hygiene and sterile gloves were used. Ultrasound guidance: relevant anatomy identified, needle position confirmed, local anesthetic spread visualized around nerve(s)., vascular puncture avoided.  Image printed for medical record. Negative aspiration and negative test dose prior to incremental administration of local anesthetic. The patient tolerated the procedure well.

## 2020-05-09 NOTE — Anesthesia Procedure Notes (Signed)
Spinal  Patient location during procedure: OR Start time: 05/09/2020 9:27 AM End time: 05/09/2020 9:31 AM Staffing Performed: resident/CRNA  Anesthesiologist: Bethena Midget, MD Resident/CRNA: Epimenio Sarin, CRNA Preanesthetic Checklist Completed: patient identified, IV checked, risks and benefits discussed, surgical consent, monitors and equipment checked, pre-op evaluation and timeout performed Spinal Block Patient position: sitting Prep: DuraPrep Patient monitoring: heart rate, cardiac monitor, continuous pulse ox and blood pressure Approach: midline Location: L3-4 Injection technique: single-shot Needle Needle type: Pencan  Needle gauge: 24 G Needle length: 10 cm Needle insertion depth: 8.5 cm Assessment Sensory level: T6

## 2020-05-09 NOTE — Discharge Instructions (Signed)
INSTRUCTIONS AFTER JOINT REPLACEMENT  ° °o Remove items at home which could result in a fall. This includes throw rugs or furniture in walking pathways °o ICE to the affected joint every three hours while awake for 30 minutes at a time, for at least the first 3-5 days, and then as needed for pain and swelling.  Continue to use ice for pain and swelling. You may notice swelling that will progress down to the foot and ankle.  This is normal after surgery.  Elevate your leg when you are not up walking on it.   °o Continue to use the breathing machine you got in the hospital (incentive spirometer) which will help keep your temperature down.  It is common for your temperature to cycle up and down following surgery, especially at night when you are not up moving around and exerting yourself.  The breathing machine keeps your lungs expanded and your temperature down. ° ° °DIET:  As you were doing prior to hospitalization, we recommend a well-balanced diet. ° °DRESSING / WOUND CARE / SHOWERING ° °You may change your dressing 3-5 days after surgery.  Then change the dressing every day with sterile gauze.  Please use good hand washing techniques before changing the dressing.  Do not use any lotions or creams on the incision until instructed by your surgeon. ° °ACTIVITY ° °o Increase activity slowly as tolerated, but follow the weight bearing instructions below.   °o No driving for 6 weeks or until further direction given by your physician.  You cannot drive while taking narcotics.  °o No lifting or carrying greater than 10 lbs. until further directed by your surgeon. °o Avoid periods of inactivity such as sitting longer than an hour when not asleep. This helps prevent blood clots.  °o You may return to work once you are authorized by your doctor.  ° ° ° °WEIGHT BEARING  ° °Weight bearing as tolerated with assist device (walker, cane, etc) as directed, use it as long as suggested by your surgeon or therapist, typically at  least 4-6 weeks. ° ° °EXERCISES ° °Results after joint replacement surgery are often greatly improved when you follow the exercise, range of motion and muscle strengthening exercises prescribed by your doctor. Safety measures are also important to protect the joint from further injury. Any time any of these exercises cause you to have increased pain or swelling, decrease what you are doing until you are comfortable again and then slowly increase them. If you have problems or questions, call your caregiver or physical therapist for advice.  ° °Rehabilitation is important following a joint replacement. After just a few days of immobilization, the muscles of the leg can become weakened and shrink (atrophy).  These exercises are designed to build up the tone and strength of the thigh and leg muscles and to improve motion. Often times heat used for twenty to thirty minutes before working out will loosen up your tissues and help with improving the range of motion but do not use heat for the first two weeks following surgery (sometimes heat can increase post-operative swelling).  ° °These exercises can be done on a training (exercise) mat, on the floor, on a table or on a bed. Use whatever works the best and is most comfortable for you.    Use music or television while you are exercising so that the exercises are a pleasant break in your day. This will make your life better with the exercises acting as a break   in your routine that you can look forward to.   Perform all exercises about fifteen times, three times per day or as directed.  You should exercise both the operative leg and the other leg as well. ° °Exercises include: °  °• Quad Sets - Tighten up the muscle on the front of the thigh (Quad) and hold for 5-10 seconds.   °• Straight Leg Raises - With your knee straight (if you were given a brace, keep it on), lift the leg to 60 degrees, hold for 3 seconds, and slowly lower the leg.  Perform this exercise against  resistance later as your leg gets stronger.  °• Leg Slides: Lying on your back, slowly slide your foot toward your buttocks, bending your knee up off the floor (only go as far as is comfortable). Then slowly slide your foot back down until your leg is flat on the floor again.  °• Angel Wings: Lying on your back spread your legs to the side as far apart as you can without causing discomfort.  °• Hamstring Strength:  Lying on your back, push your heel against the floor with your leg straight by tightening up the muscles of your buttocks.  Repeat, but this time bend your knee to a comfortable angle, and push your heel against the floor.  You may put a pillow under the heel to make it more comfortable if necessary.  ° °A rehabilitation program following joint replacement surgery can speed recovery and prevent re-injury in the future due to weakened muscles. Contact your doctor or a physical therapist for more information on knee rehabilitation.  ° ° °CONSTIPATION ° °Constipation is defined medically as fewer than three stools per week and severe constipation as less than one stool per week.  Even if you have a regular bowel pattern at home, your normal regimen is likely to be disrupted due to multiple reasons following surgery.  Combination of anesthesia, postoperative narcotics, change in appetite and fluid intake all can affect your bowels.  ° °YOU MUST use at least one of the following options; they are listed in order of increasing strength to get the job done.  They are all available over the counter, and you may need to use some, POSSIBLY even all of these options:   ° °Drink plenty of fluids (prune juice may be helpful) and high fiber foods °Colace 100 mg by mouth twice a day  °Senokot for constipation as directed and as needed Dulcolax (bisacodyl), take with full glass of water  °Miralax (polyethylene glycol) once or twice a day as needed. ° °If you have tried all these things and are unable to have a bowel  movement in the first 3-4 days after surgery call either your surgeon or your primary doctor.   ° °If you experience loose stools or diarrhea, hold the medications until you stool forms back up.  If your symptoms do not get better within 1 week or if they get worse, check with your doctor.  If you experience "the worst abdominal pain ever" or develop nausea or vomiting, please contact the office immediately for further recommendations for treatment. ° ° °ITCHING:  If you experience itching with your medications, try taking only a single pain pill, or even half a pain pill at a time.  You can also use Benadryl over the counter for itching or also to help with sleep.  ° °TED HOSE STOCKINGS:  Use stockings on both legs until for at least 2 weeks or as   directed by physician office. They may be removed at night for sleeping. ° °MEDICATIONS:  See your medication summary on the “After Visit Summary” that nursing will review with you.  You may have some home medications which will be placed on hold until you complete the course of blood thinner medication.  It is important for you to complete the blood thinner medication as prescribed. ° °PRECAUTIONS:  If you experience chest pain or shortness of breath - call 911 immediately for transfer to the hospital emergency department.  ° °If you develop a fever greater that 101 F, purulent drainage from wound, increased redness or drainage from wound, foul odor from the wound/dressing, or calf pain - CONTACT YOUR SURGEON.   °                                                °FOLLOW-UP APPOINTMENTS:  If you do not already have a post-op appointment, please call the office for an appointment to be seen by your surgeon.  Guidelines for how soon to be seen are listed in your “After Visit Summary”, but are typically between 1-4 weeks after surgery. ° °OTHER INSTRUCTIONS:  ° °Knee Replacement:  Do not place pillow under knee, focus on keeping the knee straight while resting.  ° °DENTAL  ANTIBIOTICS: ° °In most cases prophylactic antibiotics for Dental procdeures after total joint surgery are not necessary. ° °Exceptions are as follows: ° °1. History of prior total joint infection ° °2. Severely immunocompromised (Organ Transplant, cancer chemotherapy, Rheumatoid biologic °meds such as Humera) ° °3. Poorly controlled diabetes (A1C &gt; 8.0, blood glucose over 200) ° °If you have one of these conditions, contact your surgeon for an antibiotic prescription, prior to your °dental procedure. ° ° °MAKE SURE YOU:  °• Understand these instructions.  °• Get help right away if you are not doing well or get worse.  ° ° °Thank you for letting us be a part of your medical care team.  It is a privilege we respect greatly.  We hope these instructions will help you stay on track for a fast and full recovery!  ° °

## 2020-05-09 NOTE — Evaluation (Signed)
Physical Therapy Evaluation Patient Details Name: Derek Lara MRN: [id number] DOB: 05-03-1951 Today's Date: 05/09/2020   History of Present Illness  Patient is 69 y.o. male s/p Rt UKA on 05/09/20 with PMH significant for OA, HTN, DM, Lt UKA in 2019.  Clinical Impression  Kais Monje is a 69 y.o. male POD 0 s/p Rt UKA. Patient reports independence with RW for mobility at baseline. Patient is now limited by functional impairments (see PT problem list below) and requires min guard/supervision for transfers and gait with RW. Patient was able to ambulate ~95 feet with RW and min guard/supervision and cues for safe walker management. Patient educated on safe sequencing for stair mobility and verbalized safe guarding position for people assisting with mobility. Patient instructed in exercises to facilitate ROM and circulation. Patient will benefit from continued skilled PT interventions to address impairments and progress towards PLOF. Patient has met mobility goals at adequate level for discharge home; will continue to follow if pt continues acute stay to progress towards Mod I goals.     Follow Up Recommendations Follow surgeon's recommendation for DC plan and follow-up therapies;Home health PT    Equipment Recommendations  None recommended by PT    Recommendations for Other Services       Precautions / Restrictions Precautions Precautions: Fall Restrictions Weight Bearing Restrictions: No Other Position/Activity Restrictions: WBAT      Mobility  Bed Mobility Overal bed mobility: Needs Assistance Bed Mobility: Supine to Sit;Sit to Supine     Supine to sit: Min guard Sit to supine: Min guard   General bed mobility comments: no assist required, pt taking extra time to move to EOB, educated on use of belt to bring Rt LE onto bed.    Transfers Overall transfer level: Needs assistance Equipment used: Rolling walker (2 wheeled) Transfers: Sit to/from Stand Sit to  Stand: Min guard;Supervision         General transfer comment: cues for technique with RW, no assist required to rise from EOB. pt steady in standing.  Ambulation/Gait Ambulation/Gait assistance: Min guard;Supervision Gait Distance (Feet): 95 Feet Assistive device: Rolling walker (2 wheeled) Gait Pattern/deviations: Step-to pattern;Decreased stride length;Decreased stance time - right;Decreased weight shift to right Gait velocity: decr   General Gait Details: cues for step pattern/proximity to RW, no overt LOB or Rt knee buckling noted.   Stairs Stairs: Yes Stairs assistance: Min guard Stair Management: No rails;Forwards;With walker Number of Stairs: 1 General stair comments: cues for pattern "up with good, down with bad" and for safe walker placement or single forward step up with RW. educated on safe guarding position for family.   Wheelchair Mobility    Modified Rankin (Stroke Patients Only)       Balance Overall balance assessment: Needs assistance Sitting-balance support: Feet supported Sitting balance-Leahy Scale: Good     Standing balance support: During functional activity;Bilateral upper extremity supported Standing balance-Leahy Scale: Fair                               Pertinent Vitals/Pain Pain Assessment: 0-10 Pain Score: 2  Pain Location: Rt knee Pain Descriptors / Indicators: Discomfort Pain Intervention(s): Limited activity within patient's tolerance;Monitored during session;Repositioned    Home Living Family/patient expects to be discharged to:: Private residence Living Arrangements: Spouse/significant other Available Help at Discharge: Family Type of Home: House Home Access: Stairs to enter Entrance Stairs-Rails: None Entrance Stairs-Number of Steps: 1 Home Layout: Two level;Full bath on  main level;Able to live on main level with bedroom/bathroom Home Equipment: Gilford Rile - 2 wheels;Cane - single point      Prior Function Level of  Independence: Independent with assistive device(s)         Comments: pt has been using RW for 2 months due to knee pain     Hand Dominance   Dominant Hand: Right    Extremity/Trunk Assessment   Upper Extremity Assessment Upper Extremity Assessment: Overall WFL for tasks assessed    Lower Extremity Assessment Lower Extremity Assessment: RLE deficits/detail RLE Deficits / Details: good quad activation, no extensor lag with SLR RLE Sensation: WNL RLE Coordination: WNL    Cervical / Trunk Assessment Cervical / Trunk Assessment: Normal  Communication   Communication: No difficulties  Cognition Arousal/Alertness: Awake/alert Behavior During Therapy: WFL for tasks assessed/performed Overall Cognitive Status: Within Functional Limits for tasks assessed                                        General Comments      Exercises Total Joint Exercises Ankle Circles/Pumps: AROM;Both;15 reps;Supine Quad Sets: AROM;Right;Other reps (comment);Supine (3) Short Arc Quad: AROM;Right;Other reps (comment);Supine (3) Heel Slides: AROM;Right;Other reps (comment);Supine (3) Hip ABduction/ADduction: AROM;Right;Other reps (comment);Supine (3)   Assessment/Plan    PT Assessment Patient needs continued PT services  PT Problem List Decreased strength;Decreased range of motion;Decreased activity tolerance;Decreased balance;Decreased mobility;Decreased knowledge of use of DME;Decreased knowledge of precautions       PT Treatment Interventions DME instruction;Gait training;Stair training;Functional mobility training;Therapeutic activities;Therapeutic exercise;Balance training;Patient/family education    PT Goals (Current goals can be found in the Care Plan section)  Acute Rehab PT Goals Patient Stated Goal: recover and knee to stop hurting PT Goal Formulation: With patient Time For Goal Achievement: 05/16/20 Potential to Achieve Goals: Good    Frequency 7X/week    Barriers to discharge        Co-evaluation               AM-PAC PT "6 Clicks" Mobility  Outcome Measure Help needed turning from your back to your side while in a flat bed without using bedrails?: A Little Help needed moving from lying on your back to sitting on the side of a flat bed without using bedrails?: A Little Help needed moving to and from a bed to a chair (including a wheelchair)?: A Little Help needed standing up from a chair using your arms (e.g., wheelchair or bedside chair)?: A Little Help needed to walk in hospital room?: A Little Help needed climbing 3-5 steps with a railing? : A Little 6 Click Score: 18    End of Session Equipment Utilized During Treatment: Gait belt Activity Tolerance: Patient tolerated treatment well Patient left: in bed;with call bell/phone within reach Nurse Communication: Mobility status PT Visit Diagnosis: Muscle weakness (generalized) (M62.81);Difficulty in walking, not elsewhere classified (R26.2)    Time: 3212-2482 PT Time Calculation (min) (ACUTE ONLY): 35 min   Charges:   PT Evaluation $PT Eval Low Complexity: 1 Low PT Treatments $Gait Training: 8-22 mins       Verner Mould, DPT Acute Rehabilitation Services  Office [phone] Pager [phone]  05/09/2020 4:46 PM

## 2020-05-09 NOTE — Progress Notes (Signed)
AssistedDr. Oddono with right, ultrasound guided, adductor canal block. Side rails up, monitors on throughout procedure. See vital signs in flow sheet. Tolerated Procedure well.  

## 2020-05-09 NOTE — Op Note (Signed)
05/09/2020  11:43 AM  PATIENT:  Derek Lara    PRE-OPERATIVE DIAGNOSIS: Right knee anteromedial osteoarthritis  POST-OPERATIVE DIAGNOSIS:  Same  PROCEDURE:  Unicompartmental Knee Arthroplasty  SURGEON:  Eulas Post, MD  PHYSICIAN ASSISTANT: Janine Ores, PA-C, present and scrubbed throughout the case, critical for completion in a timely fashion, and for retraction, instrumentation, and closure.  ANESTHESIA:   Spinal with an abductor canal block  ESTIMATED BLOOD LOSS: 100 mL  UNIQUE ASPECTS OF THE CASE: He had a very strange morphology to his bone, his femoral condyle seemed much more narrow than it was deep, and he sized to a size large even though based on his height it might be a medium.  His contralateral side was also a large.  The ACL had a moderate amount of fibrinous mucoid degeneration, although the pattern was fairly cleanly anteromedial, as seen by complete eburnation on the anteromedial corner of the tibia.  The ACL did take on tension although some of the fibers were certainly incompetent.  My tibial cut was not overly aggressive, and yet I still ended up with a size 7.  During the trialing process, the 6 polyethylene tended to be somewhat loose, and in fact sometimes got hung up on the anterior condyle of the femur as I was moving the leg down into flexion, such that it was disengaging with the tibial tray.  I was concerned about the stability, it was certainly not impinging in the back, but the congruent arc was slightly abnormal and I am not totally sure why.  Ultimately with the 7 polyethylene real implant, it seemed to track appropriately.  His MCL was intact, I am not sure why I got all the way up to a 7, but that seemed to fit the gap best.  PREOPERATIVE INDICATIONS:  Derek Lara is a  69 y.o. male with a diagnosis of OA RIGHT KNEE who failed conservative measures and elected for surgical management.  He has had a good outcome on the contralateral side.   This was with a partial knee replacement.  The risks benefits and alternatives were discussed with the patient preoperatively including but not limited to the risks of infection, bleeding, nerve injury, cardiopulmonary complications, blood clots, the need for revision surgery, among others, and the patient was willing to proceed.  OPERATIVE IMPLANTS: Bi[employer] mobile bearing medial compartment arthroplasty femur size large, tibia size C, bearing size 7.  OPERATIVE FINDINGS: Endstage grade 4 medial compartment osteoarthritis.  He had some grade 2 diffuse changes in the patellofemoral joint, but no significant changes laterally.  The ACL took on tension, although there was certainly some of the anterior fibers that had mucoid degeneration, but the pattern was anteromedial, and I felt like his ACL was stable..  OPERATIVE PROCEDURE: The patient was brought to the operating room placed in the supine position. Anesthesia was administered. IV antibiotics were given. The lower extremity was placed in the legholder and prepped and draped in usual sterile fashion.  Time out was performed.  The leg was elevated and exsanguinated and the tourniquet was inflated. Anteromedial incision was performed, and I took care to preserve the MCL. Parapatellar incision was carried out, and the osteophytes were excised, along with the medial meniscus and a small portion of the fat pad.  The extra medullary tibial cutting jig was applied, using the spoon and the 22mm G-Clamp and the 2 mm shim, and I took care to protect the anterior cruciate ligament insertion and the tibial spine.  The medial collateral ligament was also protected, and I resected my proximal tibia, matching the anatomic slope.   The proximal tibial bony cut was removed in one piece, and I turned my attention to the femur.  The intramedullary femoral rod was placed using the drill, and then using the appropriate reference, I assembled the femoral jig,  setting my posterior cutting block. I resected my posterior femur, used the 0 spigot for the anterior femur, and then measured my gap.   I then used the appropriate mill to match the extension gap to the flexion gap. The second milling was at a 2, and then again at a 3.  The gaps were then measured again with the appropriate feeler gauges. Once I had balanced flexion and extension gaps, I then completed the preparation of the femur.  The actual measurements during this process were somewhat abnormal, because I had a 6 in flexion, and I was already at a 4 in extension before I did my second reaming.  I was not sure what to make of all of this abnormal morphology, but ultimately I balance the gaps as best I could.  I milled off the anterior aspect of the distal femur to prevent impingement. I also exposed the tibia, and selected the above-named component, and then used the cutting jig to prepare the keel slot on the tibia. I also used the awl to curette out the bone to complete the preparation of the keel. The back wall was intact.  I then placed trial components, and it was found to have excellent motion, and appropriate balance.  I then cemented the components into place, cementing the tibia first, removing all excess cement, and then cementing the femur.  All loose cement was removed.  The real polyethylene insert was applied manually, and the knee was taken through functional range of motion, and found to have excellent stability and restoration of joint motion, with excellent balance.  The wounds were irrigated copiously, and the parapatellar tissue closed with Vicryl, followed by Vicryl for the subcutaneous tissue, with routine closure with Steri-Strips and sterile gauze.  The tourniquet was released, and the patient was awakened and extubated and returned to PACU in stable and satisfactory condition. There were no complications.

## 2020-05-10 NOTE — Anesthesia Postprocedure Evaluation (Signed)
Anesthesia Post Note  Patient: Derek Lara  Procedure(s) Performed: UNICOMPARTMENTAL KNEE (Right Knee)     Patient location during evaluation: PACU Anesthesia Type: MAC Level of consciousness: oriented and awake and alert Pain management: pain level controlled Vital Signs Assessment: post-procedure vital signs reviewed and stable Respiratory status: spontaneous breathing, respiratory function stable and patient connected to nasal cannula oxygen Cardiovascular status: blood pressure returned to baseline and stable Postop Assessment: no headache, no backache and no apparent nausea or vomiting Anesthetic complications: no   No complications documented.  Last Vitals:  Vitals:   05/09/20 1600 05/09/20 1700  BP:    Pulse:    Resp:    Temp:    SpO2: 98% 98%    Last Pain:  Vitals:   05/09/20 1700  TempSrc:   PainSc: 0-No pain                 [name]

## 2020-05-16 ENCOUNTER — Encounter (HOSPITAL_COMMUNITY): Payer: Self-pay | Admitting: Orthopedic Surgery

## 2020-05-30 ENCOUNTER — Encounter: Payer: Self-pay | Admitting: Physical Therapy

## 2020-05-30 ENCOUNTER — Ambulatory Visit: Payer: Medicare Other | Attending: Orthopedic Surgery | Admitting: Physical Therapy

## 2020-05-30 ENCOUNTER — Other Ambulatory Visit: Payer: Self-pay

## 2020-05-30 DIAGNOSIS — M25661 Stiffness of right knee, not elsewhere classified: Secondary | ICD-10-CM | POA: Insufficient documentation

## 2020-05-30 DIAGNOSIS — R262 Difficulty in walking, not elsewhere classified: Secondary | ICD-10-CM | POA: Diagnosis present

## 2020-05-30 DIAGNOSIS — R2689 Other abnormalities of gait and mobility: Secondary | ICD-10-CM | POA: Insufficient documentation

## 2020-05-30 DIAGNOSIS — M79604 Pain in right leg: Secondary | ICD-10-CM | POA: Insufficient documentation

## 2020-05-30 DIAGNOSIS — M25561 Pain in right knee: Secondary | ICD-10-CM | POA: Insufficient documentation

## 2020-05-30 NOTE — Therapy (Signed)
[facility] [address] [place], [place], [address] Phone: [phone]   Fax:  [phone]  Physical Therapy Evaluation  Patient Details  Name: Derek Lara MRN: [id number] Date of Birth: 1951/04/27 Referring Provider (PT): Teryl Lucy, MD    Encounter Date: 05/30/2020   PT End of Session - 05/30/20 1323    Visit Number 1    Number of Visits 13    Date for PT Re-Evaluation 07/15/20    Authorization Type UHC MCR- FOTO visit 6, PN visit 10    Progress Note Due on Visit 10    PT Start Time 1316    PT Stop Time 1355    PT Time Calculation (min) 39 min    Activity Tolerance Patient tolerated treatment well    Behavior During Therapy [facility] for tasks assessed/performed           Past Medical History:  Diagnosis Date   Diabetes mellitus without complication (HCC)    TYPE 2    Hypertension    Primary localized osteoarthritis of left knee 02/10/2018    Past Surgical History:  Procedure Laterality Date   CATARACT EXTRACTION W/ INTRAOCULAR LENS  IMPLANT, BILATERAL     LEG SURGERY     vascular   PARTIAL KNEE ARTHROPLASTY Left 02/10/2018   Procedure: LEFT UNICOMPARTMENTAL KNEE;  Surgeon: Teryl Lucy, MD;  Location: [facility] OR;  Service: Orthopedics;  Laterality: Left;   PARTIAL KNEE ARTHROPLASTY Right 05/09/2020   Procedure: UNICOMPARTMENTAL KNEE;  Surgeon: Teryl Lucy, MD;  Location: [facility] ORS;  Service: Orthopedics;  Laterality: Right;    There were no vitals filed for this visit.    Subjective Assessment - 05/30/20 1324    Subjective S/p Rt medial knee replacement on 11/23. Using a RW today but is able to climb his stairs with a SPC in Rt hand. Numbness in lateral knee, burning medially. Adductor muscles feel tight. Pain meds are very helpful.    Patient Stated Goals walk without AD, run on treadmil, go back to gym. drive    Currently in Pain? Yes    Pain Score 8     Pain Location Knee    Pain Orientation  Right    Pain Descriptors / Indicators Burning;Dull    Aggravating Factors  walking    Pain Relieving Factors ice              [facility] PT Assessment - 05/30/20 0001      Assessment   Medical Diagnosis s/p Rt medial knee replacement    Referring Provider (PT) Teryl Lucy, MD     Onset Date/Surgical Date 05/09/20    Hand Dominance Right    Next MD Visit 06/21/20    Prior Therapy yes      Precautions   Precautions None      Restrictions   Weight Bearing Restrictions No      Balance Screen   Has the patient fallen in the past 6 months Yes    How many times? 1    Has the patient had a decrease in activity level because of a fear of falling?  Yes    Is the patient reluctant to leave their home because of a fear of falling?  No      Home Environment   Living Environment Private residence    Living Arrangements Spouse/significant other    Additional Comments 16 steps      Prior Function   Level of Independence Independent    Vocation  Requirements retired      Copy Status Within Functional Limits for tasks assessed      Observation/Other Assessments   Focus on Therapeutic Outcomes (FOTO)  38%      Sensation   Additional Comments occasional N/T in Rt foot and has a hard time controlling pressure to gas when driving.      AROM   Right Knee Extension -10    Right Knee Flexion 100      Strength   Overall Strength Comments lacks ability to demo quad set in supine, requires use of UEs to stand from chair                      Objective measurements completed on examination: See above findings.       [facility] Adult PT Treatment/Exercise - 05/30/20 0001      Knee/Hip Exercises: Stretches   Passive Hamstring Stretch Limitations seated with strap    Knee: Self-Stretch to increase Flexion Right;3 reps;10 seconds    Knee: Self-Stretch Limitations heel slide with strap    Other Knee/Hip Stretches passive adductor stretch & knee fallout       Knee/Hip Exercises: Standing   Heel Raises Both;10 reps      Knee/Hip Exercises: Seated   Long Arc Quad Right;10 reps      Knee/Hip Exercises: Supine   Quad Sets Right;10 reps      Knee/Hip Exercises: Sidelying   Hip ABduction Right;10 reps                  PT Education - 05/30/20 1402    Education Details anatomy  of condition, POC, HEP, FOTO, exercise form/rationale, Dr Dion Saucier to release to driving    Person(s) Educated Patient    Methods Explanation;Demonstration;Tactile cues;Verbal cues;Handout    Comprehension Verbalized understanding;Need further instruction;Returned demonstration;Verbal cues required;Tactile cues required            PT Short Term Goals - 05/30/20 1359      PT SHORT TERM GOAL #1   Title ROM 0-120    Baseline see flowsheet    Time 3    Period Weeks    Status New    Target Date 06/25/19             PT Long Term Goals - 05/30/20 1400      PT LONG TERM GOAL #1   Title pt will be able to perform sit<>stand without use of UE    Baseline full reliance of UE at eval    Time 6    Period Weeks    Status New    Target Date 07/16/19      PT LONG TERM GOAL #2   Title pt will be able to ambulate for at least 1 hour pain <=2/10 without AD    Baseline reports 8/10 today using RW    Time 6    Period Weeks    Status New    Target Date 07/16/19      PT LONG TERM GOAL #3   Title pt will be able to return to gym and able to demontrate good form with use of equipment    Baseline not going to gym at eval but would like to return    Time 6    Period Weeks    Status New    Target Date 07/16/19      PT LONG TERM GOAL #4   Title pt will report  ability to have full control of pressure through Rt foot for safety with walking and driving    Baseline poor control at eval    Time 6    Period Weeks    Status New    Target Date 07/16/19                  Plan - 05/30/20 1355    Clinical Impression Statement Pt presents to PT 3  weeks s/p Rt medial knee replacement. Overall he says he is doing well and pain meds are helping but reports 8/10 pain today. He is doing his HEP from HHPT and I added to the program today. He tolerated exercises well. Ambulates with antalgic pattern using RW today but reports he is able to navigate stairs with use of SPC and single hand rail. Incision is healing well and has minimal edema. Reports he is icing 3/day. Pt will benefit from skilled PT to address deficits and meet functional goals.    Personal Factors and Comorbidities Comorbidity 1    Comorbidities DM    Examination-Activity Limitations Squat;Stairs;Bend;Stand;Locomotion Level;Transfers    Examination-Participation Restrictions Driving    Stability/Clinical Decision Making Stable/Uncomplicated    Clinical Decision Making Low    Rehab Potential Good    PT Frequency 2x / week    PT Duration 6 weeks    PT Treatment/Interventions ADLs/Self Care Home Management;Cryotherapy;Moist Heat;Electrical Stimulation;Gait training;Stair training;Functional mobility training;Neuromuscular re-education;Balance training;Therapeutic exercise;Therapeutic activities;Patient/family education;Manual techniques;Scar mobilization;Passive range of motion;Taping    PT Next Visit Plan make sure he is scheduled 2/week. cont ROM and gross strength challenges    PT Home Exercise Plan [id number]    Consulted and Agree with Plan of Care Patient           Patient will benefit from skilled therapeutic intervention in order to improve the following deficits and impairments:  Abnormal gait,Decreased balance,Difficulty walking,Increased muscle spasms,Impaired sensation,Improper body mechanics,Decreased range of motion,Decreased activity tolerance,Decreased strength,Pain,Impaired flexibility  Visit Diagnosis: Acute pain of right knee - Plan: PT plan of care cert/re-cert  Stiffness of right knee, not elsewhere classified - Plan: PT plan of care cert/re-cert  Difficulty  in walking, not elsewhere classified - Plan: PT plan of care cert/re-cert     Problem List Patient Active Problem List   Diagnosis Date Noted   S/P right unicompartmental knee replacement 05/09/2020   Primary localized osteoarthritis of left knee 02/10/2018   S/P left unicompartmental knee replacement 02/10/2018    Jaquelin Meaney C. Uzma Hellmer PT, DPT 05/30/20 2:06 PM   [facility] [address] [place], [place], [address] Phone: [phone]   Fax:  [phone]  Name: Derek Lara MRN: [id number] Date of Birth: October 03, 1950

## 2020-06-02 ENCOUNTER — Ambulatory Visit: Payer: Medicare Other | Admitting: Physical Therapy

## 2020-06-02 ENCOUNTER — Encounter: Payer: Self-pay | Admitting: Physical Therapy

## 2020-06-02 ENCOUNTER — Other Ambulatory Visit: Payer: Self-pay

## 2020-06-02 DIAGNOSIS — M25661 Stiffness of right knee, not elsewhere classified: Secondary | ICD-10-CM

## 2020-06-02 DIAGNOSIS — R262 Difficulty in walking, not elsewhere classified: Secondary | ICD-10-CM

## 2020-06-02 DIAGNOSIS — M25561 Pain in right knee: Secondary | ICD-10-CM

## 2020-06-02 NOTE — Therapy (Signed)
[facility] [address] [place], [place], [address] Phone: [phone]   Fax:  [phone]  Physical Therapy Treatment  Patient Details  Name: Derek Lara MRN: [id number] Date of Birth: [birth year] Referring Provider (PT): Teryl Lucy, MD    Encounter Date: 06/02/2020   PT End of Session - 06/02/20 1017    Visit Number 2    Number of Visits 13    Date for PT Re-Evaluation 07/15/20    Authorization Type UHC MCR- FOTO visit 6, PN visit 10    Authorization Time Period FOTO at  10th    Progress Note Due on Visit 10    PT Start Time 1015    PT Stop Time 1110    PT Time Calculation (min) 55 min    Activity Tolerance Patient tolerated treatment well    Behavior During Therapy [facility] for tasks assessed/performed           Past Medical History:  Diagnosis Date   Diabetes mellitus without complication (HCC)    TYPE 2    Hypertension    Primary localized osteoarthritis of left knee 02/10/2018    Past Surgical History:  Procedure Laterality Date   CATARACT EXTRACTION W/ INTRAOCULAR LENS  IMPLANT, BILATERAL     LEG SURGERY     vascular   PARTIAL KNEE ARTHROPLASTY Left 02/10/2018   Procedure: LEFT UNICOMPARTMENTAL KNEE;  Surgeon: Teryl Lucy, MD;  Location: [facility] OR;  Service: Orthopedics;  Laterality: Left;   PARTIAL KNEE ARTHROPLASTY Right 05/09/2020   Procedure: UNICOMPARTMENTAL KNEE;  Surgeon: Teryl Lucy, MD;  Location: [facility];  Service: Orthopedics;  Laterality: Right;    There were no vitals filed for this visit.   Subjective Assessment - 06/02/20 1018    Subjective Right knee pain 6/10 pre-tx. No new complaints or concerns since evaluation. Pt. continues with RW use for mobility.    Currently in Pain? Yes    Pain Score 6     Pain Location Knee    Pain Orientation Right    Pain Descriptors / Indicators Burning;Dull    Pain Type Chronic pain    Pain Onset More than a month ago    Pain Frequency  Intermittent    Aggravating Factors  walking    Pain Relieving Factors ice    Effect of Pain on Daily Activities pain with driving                             [facility] Adult PT Treatment/Exercise - 06/02/20 0001      Knee/Hip Exercises: Stretches   Gastroc Stretch Right;3 reps;30 seconds    Gastroc Stretch Limitations standing stretch at counter    Other Knee/Hip Stretches step stretch right knee flexion with right foot on stool 5-10 sec holds x 10 reps      Knee/Hip Exercises: Aerobic   Nustep L4 x 5 min UE/LE      Knee/Hip Exercises: Standing   Heel Raises Both;1 set;15 reps    Heel Raises Limitations Airex    Hip Abduction AROM;Stengthening;Both;2 sets;10 reps;Knee straight    Abduction Limitations 3 lb. ankle wieght bilat.    Lateral Step Up Right;1 set;15 reps;Hand Hold: 2;Step Height: 2"    Forward Step Up Right;1 set;15 reps;Hand Hold: 2;Step Height: 4"    Functional Squat 2 sets;10 reps    Functional Squat Limitations partial squat at counter    Rocker Board 2 minutes    Rocker  Board Limitations dynamic balance lateral and fw/ rev x 1 min ea.    Other Standing Knee Exercises Airex marches x 20 reps      Knee/Hip Exercises: Seated   Long Arc Quad AROM;Strengthening;Right;2 sets;10 reps      Knee/Hip Exercises: Supine   Quad Sets AROM;Strengthening;Right;1 set;15 reps    Quad Sets Limitations towel roll under knee    Short Arc Quad Sets AROM;Strengthening;Right;2 sets;10 reps    Heel Prop for Knee Extension --   brief review HEP   Straight Leg Raises Limitations attmpted but unable due to weakness      Modalities   Modalities Cryotherapy      Cryotherapy   Number Minutes Cryotherapy 10 Minutes    Cryotherapy Location Knee    Type of Cryotherapy Ice pack      Manual Therapy   Joint Mobilization patellar mobilization superior/inferior glides grade I-III    Passive ROM right knee flexion sin sitting and extension in supine                   PT Education - 06/02/20 1120    Education Details HEP-emphasize knee extension stretches, exercises    Person(s) Educated Patient    Methods Explanation;Demonstration;Verbal cues    Comprehension Verbalized understanding;Returned demonstration            PT Short Term Goals - 05/30/20 1359      PT SHORT TERM GOAL #1   Title ROM 0-120    Baseline see flowsheet    Time 3    Period Weeks    Status New    Target Date 06/25/19             PT Long Term Goals - 05/30/20 1400      PT LONG TERM GOAL #1   Title pt will be able to perform sit<>stand without use of UE    Baseline full reliance of UE at eval    Time 6    Period Weeks    Status New    Target Date 07/16/19      PT LONG TERM GOAL #2   Title pt will be able to ambulate for at least 1 hour pain <=2/10 without AD    Baseline reports 8/10 today using RW    Time 6    Period Weeks    Status New    Target Date 07/16/19      PT LONG TERM GOAL #3   Title pt will be able to return to gym and able to demontrate good form with use of equipment    Baseline not going to gym at eval but would like to return    Time 6    Period Weeks    Status New    Target Date 07/16/19      PT LONG TERM GOAL #4   Title pt will report ability to have full control of pressure through Rt foot for safety with walking and driving    Baseline poor control at eval    Time 6    Period Weeks    Status New    Target Date 07/16/19                 Plan - 06/02/20 1122    Clinical Impression Statement Pt. returns for 2nd visit s/p right uni-compartmental knee replacement-able to progess standing/closed chain activities as noted per flowsheet with good tolerance. For knee ROM progressing well with flexion but knee extension still  limited in particular so emphasis manual therapy to address. Plan continue PT for further progress to help improve knee ROM and strength to address functional limitations for mobility.     Personal Factors and Comorbidities Comorbidity 1    Comorbidities DM    Examination-Activity Limitations Squat;Stairs;Bend;Stand;Locomotion Level;Transfers    Examination-Participation Restrictions Driving    Stability/Clinical Decision Making Stable/Uncomplicated    Clinical Decision Making Low    Rehab Potential Good    PT Frequency 2x / week    PT Duration 6 weeks    PT Treatment/Interventions ADLs/Self Care Home Management;Cryotherapy;Moist Heat;Electrical Stimulation;Gait training;Stair training;Functional mobility training;Neuromuscular re-education;Balance training;Therapeutic exercise;Therapeutic activities;Patient/family education;Manual techniques;Scar mobilization;Passive range of motion;Taping    PT Next Visit Plan progress as tolerated with knee strengthening/closed chain activities, balance/proprioceptive challenges, knee ROM as tolerated    PT Home Exercise Plan [id number]    Consulted and Agree with Plan of Care Patient           Patient will benefit from skilled therapeutic intervention in order to improve the following deficits and impairments:  Abnormal gait,Decreased balance,Difficulty walking,Increased muscle spasms,Impaired sensation,Improper body mechanics,Decreased range of motion,Decreased activity tolerance,Decreased strength,Pain,Impaired flexibility  Visit Diagnosis: Acute pain of right knee  Stiffness of right knee, not elsewhere classified  Difficulty in walking, not elsewhere classified     Problem List Patient Active Problem List   Diagnosis Date Noted   S/P right unicompartmental knee replacement 05/09/2020   Primary localized osteoarthritis of left knee 02/10/2018   S/P left unicompartmental knee replacement 02/10/2018   Lazarus Gowda, PT, DPT 06/02/20 11:26 AM  [facility]patient [facility] [address] [place], [place], [address] Phone: [phone]   Fax:  [phone]  Name: Derek Lara MRN: [id number] Date of Birth: 08-19-1950

## 2020-06-07 ENCOUNTER — Encounter: Payer: Self-pay | Admitting: Physical Therapy

## 2020-06-07 ENCOUNTER — Other Ambulatory Visit: Payer: Self-pay

## 2020-06-07 ENCOUNTER — Ambulatory Visit: Payer: Medicare Other | Admitting: Physical Therapy

## 2020-06-07 DIAGNOSIS — M79604 Pain in right leg: Secondary | ICD-10-CM

## 2020-06-07 DIAGNOSIS — M25661 Stiffness of right knee, not elsewhere classified: Secondary | ICD-10-CM

## 2020-06-07 DIAGNOSIS — R262 Difficulty in walking, not elsewhere classified: Secondary | ICD-10-CM

## 2020-06-07 DIAGNOSIS — M25561 Pain in right knee: Secondary | ICD-10-CM

## 2020-06-07 DIAGNOSIS — R2689 Other abnormalities of gait and mobility: Secondary | ICD-10-CM

## 2020-06-07 NOTE — Therapy (Signed)
[facility] Outpatient Rehabilitation [facility] [address] [place], [place], [address] Phone: [phone]   Fax:  [phone]  Physical Therapy Treatment  Patient Details  Name: Derek Lara MRN: [id number] Date of Birth: [birth year] Referring Provider (PT): Teryl Lucy, MD    Encounter Date: 06/07/2020   PT End of Session - 06/07/20 1502    Visit Number 3    Number of Visits 13    Date for PT Re-Evaluation 07/15/20    Authorization Type UHC MCR- FOTO visit 6, PN visit 10    Authorization Time Period FOTO at  10th    Progress Note Due on Visit 10    PT Start Time 1501    PT Stop Time 1541    PT Time Calculation (min) 40 min    Equipment Utilized During Treatment Gait belt    Activity Tolerance Patient tolerated treatment well    Behavior During Therapy [facility] for tasks assessed/performed           Past Medical History:  Diagnosis Date  . Diabetes mellitus without complication (HCC)    TYPE 2   . Hypertension   . Primary localized osteoarthritis of left knee 02/10/2018    Past Surgical History:  Procedure Laterality Date  . CATARACT EXTRACTION W/ INTRAOCULAR LENS  IMPLANT, BILATERAL    . LEG SURGERY     vascular  . PARTIAL KNEE ARTHROPLASTY Left 02/10/2018   Procedure: LEFT UNICOMPARTMENTAL KNEE;  Surgeon: Teryl Lucy, MD;  Location: [facility] OR;  Service: Orthopedics;  Laterality: Left;  . PARTIAL KNEE ARTHROPLASTY Right 05/09/2020   Procedure: UNICOMPARTMENTAL KNEE;  Surgeon: Teryl Lucy, MD;  Location: [facility];  Service: Orthopedics;  Laterality: Right;    There were no vitals filed for this visit.   Subjective Assessment - 06/07/20 1502    Subjective Doing his exercises at home, feels they are getting easy.    Patient Stated Goals walk without AD, run on treadmil, go back to gym. drive    Currently in Pain? No/denies                             [facility] Adult PT Treatment/Exercise - 06/07/20 0001      Knee/Hip  Exercises: Stretches   Knee: Self-Stretch to increase Flexion Right;5 reps;10 seconds   to 110 deg today   Knee: Self-Stretch Limitations heel slide with strap    Gastroc Stretch Limitations slant baord x2 and showed lunge position    Other Knee/Hip Stretches heel prop 3 min      Knee/Hip Exercises: Aerobic   Stationary Bike 5 min L4      Knee/Hip Exercises: Standing   Heel Raises 15 reps;Both    Functional Squat Limitations mini squats at [facility]    Gait Training without AD- gait belt for safety      Knee/Hip Exercises: Supine   Short Arc Quad Sets Right;Strengthening;10 reps    Short Arc Quad Sets Limitations 5s holds    Straight Leg Raises Limitations quad set to SLR      Knee/Hip Exercises: Sidelying   Hip ABduction Right;20 reps      Manual Therapy   Joint Mobilization patellar mobs 4 directions    Soft tissue mobilization scar mobs                    PT Short Term Goals - 05/30/20 1359      PT SHORT TERM GOAL #1  Title ROM 0-120    Baseline see flowsheet    Time 3    Period Weeks    Status New    Target Date 06/25/19             PT Long Term Goals - 05/30/20 1400      PT LONG TERM GOAL #1   Title pt will be able to perform sit<>stand without use of UE    Baseline full reliance of UE at eval    Time 6    Period Weeks    Status New    Target Date 07/16/19      PT LONG TERM GOAL #2   Title pt will be able to ambulate for at least 1 hour pain <=2/10 without AD    Baseline reports 8/10 today using RW    Time 6    Period Weeks    Status New    Target Date 07/16/19      PT LONG TERM GOAL #3   Title pt will be able to return to gym and able to demontrate good form with use of equipment    Baseline not going to gym at eval but would like to return    Time 6    Period Weeks    Status New    Target Date 07/16/19      PT LONG TERM GOAL #4   Title pt will report ability to have full control of pressure through Rt foot for safety with walking and  driving    Baseline poor control at eval    Time 6    Period Weeks    Status New    Target Date 07/16/19                 Plan - 06/07/20 1542    Clinical Impression Statement Cues required to maintain quad activation and reduce quad lag. Practiced gait without RW today and was a much better pattern- will move to [facility] for safety until he is a little stronger.    PT Treatment/Interventions ADLs/Self Care Home Management;Cryotherapy;Moist Heat;Electrical Stimulation;Gait training;Stair training;Functional mobility training;Neuromuscular re-education;Balance training;Therapeutic exercise;Therapeutic activities;Patient/family education;Manual techniques;Scar mobilization;Passive range of motion;Taping    PT Next Visit Plan pt. on waitlist for 2nd appointment next week and will also try to call for availability, progress as tolerated with knee strengthening/closed chain activities, balance/proprioceptive challenges, knee ROM as tolerated    PT Home Exercise Plan [id number], gait with SPC- heel/toe    Consulted and Agree with Plan of Care Patient           Patient will benefit from skilled therapeutic intervention in order to improve the following deficits and impairments:  Abnormal gait,Decreased balance,Difficulty walking,Increased muscle spasms,Impaired sensation,Improper body mechanics,Decreased range of motion,Decreased activity tolerance,Decreased strength,Pain,Impaired flexibility  Visit Diagnosis: Acute pain of right knee  Stiffness of right knee, not elsewhere classified  Difficulty in walking, not elsewhere classified  Pain in right leg  Other abnormalities of gait and mobility     Problem List Patient Active Problem List   Diagnosis Date Noted  . S/P right unicompartmental knee replacement 05/09/2020  . Primary localized osteoarthritis of left knee 02/10/2018  . S/P left unicompartmental knee replacement 02/10/2018    Ramirez Fullbright C. Wilberto Console PT, DPT 06/07/20 3:44  PM   [facility] [address] [place], [place], [address] Phone: [phone]   Fax:  [phone]  Name: Derek Lara MRN: [id number] Date of Birth: [birth year]

## 2020-06-14 ENCOUNTER — Ambulatory Visit: Payer: Medicare Other

## 2020-06-14 DIAGNOSIS — M25561 Pain in right knee: Secondary | ICD-10-CM

## 2020-06-14 DIAGNOSIS — R2689 Other abnormalities of gait and mobility: Secondary | ICD-10-CM

## 2020-06-14 DIAGNOSIS — M79604 Pain in right leg: Secondary | ICD-10-CM

## 2020-06-14 DIAGNOSIS — M25661 Stiffness of right knee, not elsewhere classified: Secondary | ICD-10-CM

## 2020-06-14 DIAGNOSIS — R262 Difficulty in walking, not elsewhere classified: Secondary | ICD-10-CM

## 2020-06-15 NOTE — Therapy (Signed)
[facility] [address] [place], [place], [address] Phone: [phone]   Fax:  [phone]  Physical Therapy Treatment  Patient Details  Name: Derek Lara MRN: [id number] Date of Birth: Mar 18, 1951 Referring Provider (PT): Teryl Lucy, MD    Encounter Date: 06/14/2020   PT End of Session - 06/15/20 0631    Visit Number 4    Number of Visits 13    Date for PT Re-Evaluation 07/15/20    Authorization Type UHC MCR- [place] visit 6, PN visit 10    Authorization Time Period FOTO at  10th    Progress Note Due on Visit 10    PT Start Time 1455    PT Stop Time 1535    PT Time Calculation (min) 40 min    Equipment Utilized During Treatment Other (comment)   SPC   Activity Tolerance Patient tolerated treatment well    Behavior During Therapy [facility] for tasks assessed/performed           Past Medical History:  Diagnosis Date  . Diabetes mellitus without complication (HCC)    TYPE 2   . Hypertension   . Primary localized osteoarthritis of left knee 02/10/2018    Past Surgical History:  Procedure Laterality Date  . CATARACT EXTRACTION W/ INTRAOCULAR LENS  IMPLANT, BILATERAL    . LEG SURGERY     vascular  . PARTIAL KNEE ARTHROPLASTY Left 02/10/2018   Procedure: LEFT UNICOMPARTMENTAL KNEE;  Surgeon: Teryl Lucy, MD;  Location: [facility];  Service: Orthopedics;  Laterality: Left;  . PARTIAL KNEE ARTHROPLASTY Right 05/09/2020   Procedure: UNICOMPARTMENTAL KNEE;  Surgeon: Teryl Lucy, MD;  Location: [facility];  Service: Orthopedics;  Laterality: Right;    There were no vitals filed for this visit.   Subjective Assessment - 06/14/20 1507    Subjective Pt reports he is doing well and he is now using the SLP for assist instead of the walker    How long can you sit comfortably? unlimited    How long can you stand comfortably? 30 min    How long can you walk comfortably? 30 min    Diagnostic tests N/A    Patient Stated Goals  walk without AD, run on treadmil, go back to gym. drive    Currently in Pain? Yes    Pain Score 4     Pain Location Knee    Pain Orientation Right    Pain Descriptors / Indicators Burning;Dull    Pain Type Chronic pain    Pain Onset More than a month ago    Pain Frequency Intermittent    Aggravating Factors  Walking    Pain Relieving Factors Ince    Effect of Pain on Daily Activities Pain with driving              [facility] PT Assessment - 06/15/20 0001      AROM   Right Knee Extension -15   -27 ext lag   Right Knee Flexion 108                         [facility] Adult PT Treatment/Exercise - 06/15/20 0001      Ambulation/Gait   Assistive device Straight cane    Gait Pattern Step-through pattern    Gait Comments for heel to toe gait pattern over the R LE      Knee/Hip Exercises: Stretches   Passive Hamstring Stretch Limitations seated with strap; 5x; 20sec  Other Knee/Hip Stretches heel prop 3 min      Knee/Hip Exercises: Aerobic   Nustep L4 x 5 min, UE/LE      Knee/Hip Exercises: Supine   Quad Sets 10 reps;Right    Quad Sets Limitations 5 sec, towel roll under knee for feed back    Short Arc Quad Sets Right;10 reps    Short Arc Quad Sets Limitations 5 sec    Straight Leg Raises Limitations 10x; quad set prior to SLR      Knee/Hip Exercises: Sidelying   Hip ABduction Right;15 reps    Clams R, 15 reps                  PT Education - 06/15/20 0630    Education Details With decreased knee ext, emphasis with HEP for knee ext exs.    Person(s) Educated Patient    Methods Explanation;Tactile cues;Verbal cues;Demonstration    Comprehension Verbalized understanding;Returned demonstration;Verbal cues required;Tactile cues required            PT Short Term Goals - 05/30/20 1359      PT SHORT TERM GOAL #1   Title ROM 0-120    Baseline see flowsheet    Time 3    Period Weeks    Status New    Target Date 06/25/19             PT Long Term  Goals - 05/30/20 1400      PT LONG TERM GOAL #1   Title pt will be able to perform sit<>stand without use of UE    Baseline full reliance of UE at eval    Time 6    Period Weeks    Status New    Target Date 07/16/19      PT LONG TERM GOAL #2   Title pt will be able to ambulate for at least 1 hour pain <=2/10 without AD    Baseline reports 8/10 today using RW    Time 6    Period Weeks    Status New    Target Date 07/16/19      PT LONG TERM GOAL #3   Title pt will be able to return to gym and able to demontrate good form with use of equipment    Baseline not going to gym at eval but would like to return    Time 6    Period Weeks    Status New    Target Date 07/16/19      PT LONG TERM GOAL #4   Title pt will report ability to have full control of pressure through Rt foot for safety with walking and driving    Baseline poor control at eval    Time 6    Period Weeks    Status New    Target Date 07/16/19                 Plan - 06/15/20 0646    Clinical Impression Statement Pt presents to PT using a SPC for support. Gait training was provided to for heel to toe gait pattern and use of SPC. Decreased R knee ext limits this gait pattern. Pt returned demonstration with proper use of SPC and a heel/toe pattern as R knee ext allows. PT was completed for R LE strengthening and for R knee ext ROM. Ext AROM c quad set and SAQ are decreased at -15d and -27d respectively. Pt was instructed to complete quads and knee ext/hamstring stretch  3x a day as part of his HEP. R knee flexion is making appropriate progress.    Personal Factors and Comorbidities Comorbidity 1    Comorbidities DM    Examination-Activity Limitations Squat;Stairs;Bend;Stand;Locomotion Level;Transfers    Examination-Participation Restrictions Driving    Stability/Clinical Decision Making Stable/Uncomplicated    Clinical Decision Making Low    Rehab Potential Good    PT Frequency 2x / week    PT Duration 6 weeks     PT Treatment/Interventions ADLs/Self Care Home Management;Cryotherapy;Moist Heat;Electrical Stimulation;Gait training;Stair training;Functional mobility training;Neuromuscular re-education;Balance training;Therapeutic exercise;Therapeutic activities;Patient/family education;Manual techniques;Scar mobilization;Passive range of motion;Taping    PT Next Visit Plan progress as tolerated with knee strengthening/closed chain activities, balance/proprioceptive challenges, knee ROM as tolerated    PT Home Exercise Plan [id number], gait with SPC- heel/toe    Consulted and Agree with Plan of Care Patient           Patient will benefit from skilled therapeutic intervention in order to improve the following deficits and impairments:  Abnormal gait,Decreased balance,Difficulty walking,Increased muscle spasms,Impaired sensation,Improper body mechanics,Decreased range of motion,Decreased activity tolerance,Decreased strength,Pain,Impaired flexibility  Visit Diagnosis: Stiffness of right knee, not elsewhere classified  Acute pain of right knee  Difficulty in walking, not elsewhere classified  Pain in right leg  Other abnormalities of gait and mobility     Problem List Patient Active Problem List   Diagnosis Date Noted  . S/P right unicompartmental knee replacement 05/09/2020  . Primary localized osteoarthritis of left knee 02/10/2018  . S/P left unicompartmental knee replacement 02/10/2018   Joellyn Rued MS, PT 06/15/20 6:58 AM  [facility] [address] [place], [place], [address] Phone: [phone]   Fax:  [phone]  Name: Maikol Grassia MRN: [id number] Date of Birth: 06-24-1950

## 2020-06-19 ENCOUNTER — Encounter: Payer: Self-pay | Admitting: Physical Therapy

## 2020-06-19 ENCOUNTER — Other Ambulatory Visit: Payer: Self-pay

## 2020-06-19 ENCOUNTER — Ambulatory Visit: Payer: Medicare Other | Attending: Orthopedic Surgery | Admitting: Physical Therapy

## 2020-06-19 DIAGNOSIS — R2689 Other abnormalities of gait and mobility: Secondary | ICD-10-CM | POA: Insufficient documentation

## 2020-06-19 DIAGNOSIS — M25661 Stiffness of right knee, not elsewhere classified: Secondary | ICD-10-CM | POA: Diagnosis present

## 2020-06-19 DIAGNOSIS — R262 Difficulty in walking, not elsewhere classified: Secondary | ICD-10-CM | POA: Diagnosis present

## 2020-06-19 DIAGNOSIS — M25561 Pain in right knee: Secondary | ICD-10-CM | POA: Insufficient documentation

## 2020-06-19 DIAGNOSIS — M79604 Pain in right leg: Secondary | ICD-10-CM | POA: Insufficient documentation

## 2020-06-19 NOTE — Therapy (Signed)
[place], [place], [address] Phone: [phone]   Fax:  [phone]  Physical Therapy Treatment  Patient Details  Name: Derek Lara MRN: [id number] Date of Birth: 1950/10/25 Referring Provider (PT): Marchia Bond, MD    Encounter Date: 06/19/2020   PT End of Session - 06/19/20 1546    Visit Number 5    Number of Visits 13    Date for PT Re-Evaluation 07/15/20    Authorization Type UHC [place] visit 6, PN visit 10    Authorization Time Period FOTO at  10th    Progress Note Due on Visit 10    PT Start Time 1547    PT Stop Time 1626    PT Time Calculation (min) 39 min    Activity Tolerance Patient tolerated treatment well    Behavior During Therapy [facility] for tasks assessed/performed           Past Medical History:  Diagnosis Date  . Diabetes mellitus without complication ([place])    TYPE 2   . Hypertension   . Primary localized osteoarthritis of left knee 02/10/2018    Past Surgical History:  Procedure Laterality Date  . CATARACT EXTRACTION W/ INTRAOCULAR LENS  IMPLANT, BILATERAL    . LEG SURGERY     vascular  . PARTIAL KNEE ARTHROPLASTY Left 02/10/2018   Procedure: LEFT UNICOMPARTMENTAL KNEE;  Surgeon: Marchia Bond, MD;  Location: [facility];  Service: Orthopedics;  Laterality: Left;  . PARTIAL KNEE ARTHROPLASTY Right 05/09/2020   Procedure: UNICOMPARTMENTAL KNEE;  Surgeon: Marchia Bond, MD;  Location[facility];  Service: Orthopedics;  Laterality: Right;    There were no vitals filed for this visit.   Subjective Assessment - 06/19/20 1549    Subjective "I have no pain today, I am working on going around the house without the cane."    Patient Stated Goals walk without AD, run on treadmil, go back to gym. drive    Currently in Pain? No/denies              [facility] PT Assessment - 06/19/20 0001      AROM   Right Knee Extension 10    Right Knee Flexion 115                          [facility] Adult PT Treatment/Exercise - 06/19/20 0001      Knee/Hip Exercises: Stretches   Passive Hamstring Stretch 2 reps;30 seconds;Right   with strap   Quad Stretch 2 reps;Right;30 seconds    Gastroc Stretch Limitations slant board 2 x 30 sec      Knee/Hip Exercises: Aerobic   Stationary Bike L3 x 6 min   Lowering seat at 3 min to promote knee flexion     Knee/Hip Exercises: Machines for Strengthening   Cybex Knee Extension 1 x 10 10# bil, 1 x 10 10# RLE only    Cybex Knee Flexion 1 x 10 20# bil LE, 1 x 10 20# RLE only      Knee/Hip Exercises: Standing   Heel Raises 2 sets;20 reps;Both    Functional Squat Limitations 2 x 10 holding onto free motion.   cues for proper squat form   Gait Training heel strike/ toe off 4 x 50 ft taking small steps and cues for reciprocal arm swing   demontration for proper form. p tnoted increased soreness with larger steps.     Knee/Hip Exercises: Supine  Quad Sets Strengthening;Right;15 reps      Manual Therapy   Joint Mobilization tibial ER with active quad activation                    PT Short Term Goals - 05/30/20 1359      PT SHORT TERM GOAL #1   Title ROM 0-120    Baseline see flowsheet    Time 3    Period Weeks    Status New    Target Date 06/25/19             PT Long Term Goals - 05/30/20 1400      PT LONG TERM GOAL #1   Title pt will be able to perform sit<>stand without use of UE    Baseline full reliance of UE at eval    Time 6    Period Weeks    Status New    Target Date 07/16/19      PT LONG TERM GOAL #2   Title pt will be able to ambulate for at least 1 hour pain <=2/10 without AD    Baseline reports 8/10 today using RW    Time 6    Period Weeks    Status New    Target Date 07/16/19      PT LONG TERM GOAL #3   Title pt will be able to return to gym and able to demontrate good form with use of equipment    Baseline not going to gym at eval but would like to return     Time 6    Period Weeks    Status New    Target Date 07/16/19      PT LONG TERM GOAL #4   Title pt will report ability to have full control of pressure through Rt foot for safety with walking and driving    Baseline poor control at eval    Time 6    Period Weeks    Status New    Target Date 07/16/19                 Plan - 06/19/20 1615    Clinical Impression Statement pt continues to do well with PT increasing knee ROM from 10 - 115 degrees today. he does report pain in the calf that occurs mostly when laying down. continued working on hip/ knee strengthenign with increased reps to promote strength as well as endurance. He was able to do all exercises with minimal cues for proper form. practiced gait training utilzing smaller steps with emphasis on heel strike/ toe off without any AD. no report of pain at end of session.    PT Treatment/Interventions ADLs/Self Care Home Management;Cryotherapy;Moist Heat;Electrical Stimulation;Gait training;Stair training;Functional mobility training;Neuromuscular re-education;Balance training;Therapeutic exercise;Therapeutic activities;Patient/family education;Manual techniques;Scar mobilization;Passive range of motion;Taping    PT Next Visit Plan progress as tolerated with knee strengthening/closed chain activities, balance/proprioceptive challenges, knee ROM as tolerated    PT Home Exercise Plan [id number], gait with SPC- heel/toe    Consulted and Agree with Plan of Care Patient           Patient will benefit from skilled therapeutic intervention in order to improve the following deficits and impairments:  Abnormal gait,Decreased balance,Difficulty walking,Increased muscle spasms,Impaired sensation,Improper body mechanics,Decreased range of motion,Decreased activity tolerance,Decreased strength,Pain,Impaired flexibility  Visit Diagnosis: Stiffness of right knee, not elsewhere classified  Acute pain of right knee  Difficulty in walking, not  elsewhere classified  Pain in right leg  Other abnormalities of gait and mobility     Problem List Patient Active Problem List   Diagnosis Date Noted  . S/P right unicompartmental knee replacement 05/09/2020  . Primary localized osteoarthritis of left knee 02/10/2018  . S/P left unicompartmental knee replacement 02/10/2018    Lulu Riding PT, DPT, LAT, ATC  06/19/20  4:34 PM      [facility] [address] [place], [place], [address] Phone: [phone]   Fax:  [phone]  Name: Derek Lara MRN: [id number] Date of Birth: 10/14/1950

## 2020-06-21 ENCOUNTER — Ambulatory Visit: Payer: Medicare Other | Admitting: Physical Therapy

## 2020-06-21 ENCOUNTER — Encounter: Payer: Self-pay | Admitting: Physical Therapy

## 2020-06-21 ENCOUNTER — Other Ambulatory Visit: Payer: Self-pay

## 2020-06-21 DIAGNOSIS — M25561 Pain in right knee: Secondary | ICD-10-CM

## 2020-06-21 DIAGNOSIS — M79604 Pain in right leg: Secondary | ICD-10-CM

## 2020-06-21 DIAGNOSIS — M25661 Stiffness of right knee, not elsewhere classified: Secondary | ICD-10-CM

## 2020-06-21 DIAGNOSIS — R2689 Other abnormalities of gait and mobility: Secondary | ICD-10-CM

## 2020-06-21 DIAGNOSIS — R262 Difficulty in walking, not elsewhere classified: Secondary | ICD-10-CM

## 2020-06-21 NOTE — Therapy (Signed)
[place], [place], [address] Phone: [phone]   Fax:  [phone]  Physical Therapy Treatment  Patient Details  Name: Derek Lara MRN: [id number] Date of Birth: August 02, 1950 Referring Provider (PT): Marchia Bond, MD    Encounter Date: 06/21/2020   PT End of Session - 06/21/20 1621    Visit Number 6    Number of Visits 13    Date for PT Re-Evaluation 07/15/20    Authorization Type UHC [place] visit 6, PN visit 10    Authorization Time Period FOTO at  10th    PT Start Time 1545    PT Stop Time 1626    PT Time Calculation (min) 41 min    Activity Tolerance Patient tolerated treatment well    Behavior During Therapy [facility] for tasks assessed/performed           Past Medical History:  Diagnosis Date  . Diabetes mellitus without complication ([place])    TYPE 2   . Hypertension   . Primary localized osteoarthritis of left knee 02/10/2018    Past Surgical History:  Procedure Laterality Date  . CATARACT EXTRACTION W/ INTRAOCULAR LENS  IMPLANT, BILATERAL    . LEG SURGERY     vascular  . PARTIAL KNEE ARTHROPLASTY Left 02/10/2018   Procedure: LEFT UNICOMPARTMENTAL KNEE;  Surgeon: Marchia Bond, MD;  Location: [facility];  Service: Orthopedics;  Laterality: Left;  . PARTIAL KNEE ARTHROPLASTY Right 05/09/2020   Procedure: UNICOMPARTMENTAL KNEE;  Surgeon: Marchia Bond, MD;  Location: [facility];  Service: Orthopedics;  Laterality: Right;    There were no vitals filed for this visit.   Subjective Assessment - 06/21/20 1552    Subjective Patient continues to report some pain in his calf. He reports just a little soreness in his knee. Overa;ll he feels like e is doing well.    How long can you sit comfortably? unlimited    How long can you stand comfortably? 30 min    How long can you walk comfortably? 30 min    Diagnostic tests N/A    Patient Stated Goals walk without AD, run on treadmil, go back to gym.  drive    Currently in Pain? Yes    Pain Score 4     Pain Location Knee    Pain Orientation Right;Left    Pain Descriptors / Indicators Aching    Pain Type Chronic pain    Pain Onset More than a month ago    Aggravating Factors  waling    Pain Relieving Factors ice    Effect of Pain on Daily Activities pain with driving                             [facility] Adult PT Treatment/Exercise - 06/21/20 0001      Knee/Hip Exercises: Stretches   Gastroc Stretch Limitations 2x20 sec hold with towel      Knee/Hip Exercises: Aerobic   Nustep L4 x 5 min, UE/LE      Knee/Hip Exercises: Standing   Heel Raises 2 sets;20 reps;Both    Functional Squat Limitations 2 x 10 holding onto free motion.   cues for proper squat form     Knee/Hip Exercises: Supine   Quad Sets --    Straight Leg Raises Limitations 10x; quad set prior to SLR      Manual Therapy   Manual Therapy Joint mobilization    Manual therapy  comments trigger point release to posterior knee; manual trigger point release to medial gastroc    Joint Mobilization tibial ER with active quad activation                  PT Education - 06/21/20 1552    Education Details HEP and symptom mangement    Person(s) Educated Patient    Methods Explanation;Demonstration;Tactile cues;Verbal cues    Comprehension Verbalized understanding;Verbal cues required;Returned demonstration;Tactile cues required            PT Short Term Goals - 05/30/20 1359      PT SHORT TERM GOAL #1   Title ROM 0-120    Baseline see flowsheet    Time 3    Period Weeks    Status New    Target Date 06/25/19             PT Long Term Goals - 05/30/20 1400      PT LONG TERM GOAL #1   Title pt will be able to perform sit<>stand without use of UE    Baseline full reliance of UE at eval    Time 6    Period Weeks    Status New    Target Date 07/16/19      PT LONG TERM GOAL #2   Title pt will be able to ambulate for at least 1 hour  pain <=2/10 without AD    Baseline reports 8/10 today using RW    Time 6    Period Weeks    Status New    Target Date 07/16/19      PT LONG TERM GOAL #3   Title pt will be able to return to gym and able to demontrate good form with use of equipment    Baseline not going to gym at eval but would like to return    Time 6    Period Weeks    Status New    Target Date 07/16/19      PT LONG TERM GOAL #4   Title pt will report ability to have full control of pressure through Rt foot for safety with walking and driving    Baseline poor control at eval    Time 6    Period Weeks    Status New    Target Date 07/16/19                 Plan - 06/21/20 1635    Clinical Impression Statement Patient is making good progress.Marland Kitchen He continues to toe out with gait and take long strides but it improves with cuing. He is having very little pain. Therapy added stair training today. He continues to have some posterior knee tightness and spasming in hois medial gastroc. therayp reviewed gastroc stretch with strap. We will continue to progress as tolerated.    Personal Factors and Comorbidities Comorbidity 1    Comorbidities DM    Examination-Activity Limitations Squat;Stairs;Bend;Stand;Locomotion Level;Transfers    Examination-Participation Restrictions Driving    Stability/Clinical Decision Making Stable/Uncomplicated    Clinical Decision Making Low    Rehab Potential Good    PT Frequency 2x / week    PT Duration 6 weeks    PT Treatment/Interventions ADLs/Self Care Home Management;Cryotherapy;Moist Heat;Electrical Stimulation;Gait training;Stair training;Functional mobility training;Neuromuscular re-education;Balance training;Therapeutic exercise;Therapeutic activities;Patient/family education;Manual techniques;Scar mobilization;Passive range of motion;Taping    PT Next Visit Plan progress as tolerated with knee strengthening/closed chain activities, balance/proprioceptive challenges, knee ROM as  tolerated    PT Home  Exercise Plan [id number], gait with SPC- heel/toe    Consulted and Agree with Plan of Care Patient           Patient will benefit from skilled therapeutic intervention in order to improve the following deficits and impairments:  Abnormal gait,Decreased balance,Difficulty walking,Increased muscle spasms,Impaired sensation,Improper body mechanics,Decreased range of motion,Decreased activity tolerance,Decreased strength,Pain,Impaired flexibility  Visit Diagnosis: Stiffness of right knee, not elsewhere classified  Acute pain of right knee  Difficulty in walking, not elsewhere classified  Pain in right leg  Other abnormalities of gait and mobility     Problem List Patient Active Problem List   Diagnosis Date Noted  . S/P right unicompartmental knee replacement 05/09/2020  . Primary localized osteoarthritis of left knee 02/10/2018  . S/P left unicompartmental knee replacement 02/10/2018    Dessie Coma PT DPT  06/21/2020, 4:38 PM  [facility] [address] [place], [place], [address] Phone: [phone]   Fax:  [phone]  Name: Derek Lara MRN: [id number] Date of Birth: 1951-04-13

## 2020-06-26 ENCOUNTER — Other Ambulatory Visit: Payer: Self-pay

## 2020-06-26 ENCOUNTER — Ambulatory Visit: Payer: Medicare Other

## 2020-06-26 DIAGNOSIS — R262 Difficulty in walking, not elsewhere classified: Secondary | ICD-10-CM

## 2020-06-26 DIAGNOSIS — M25561 Pain in right knee: Secondary | ICD-10-CM

## 2020-06-26 DIAGNOSIS — M25661 Stiffness of right knee, not elsewhere classified: Secondary | ICD-10-CM

## 2020-06-26 DIAGNOSIS — M79604 Pain in right leg: Secondary | ICD-10-CM

## 2020-06-26 DIAGNOSIS — R2689 Other abnormalities of gait and mobility: Secondary | ICD-10-CM

## 2020-06-26 IMAGING — DX DG KNEE 1-2V PORT*L*
1 series · 2 of 2 positions shown · non-contrast
Comparison: Left knee films of 04/30/2007

CLINICAL DATA: Unicompartmental left knee replacement

EXAM:
PORTABLE LEFT KNEE - 1-2 VIEW

[Series 1: knee · 0.14mm/px · 2 of 2 slices shown]
[im 1/2]
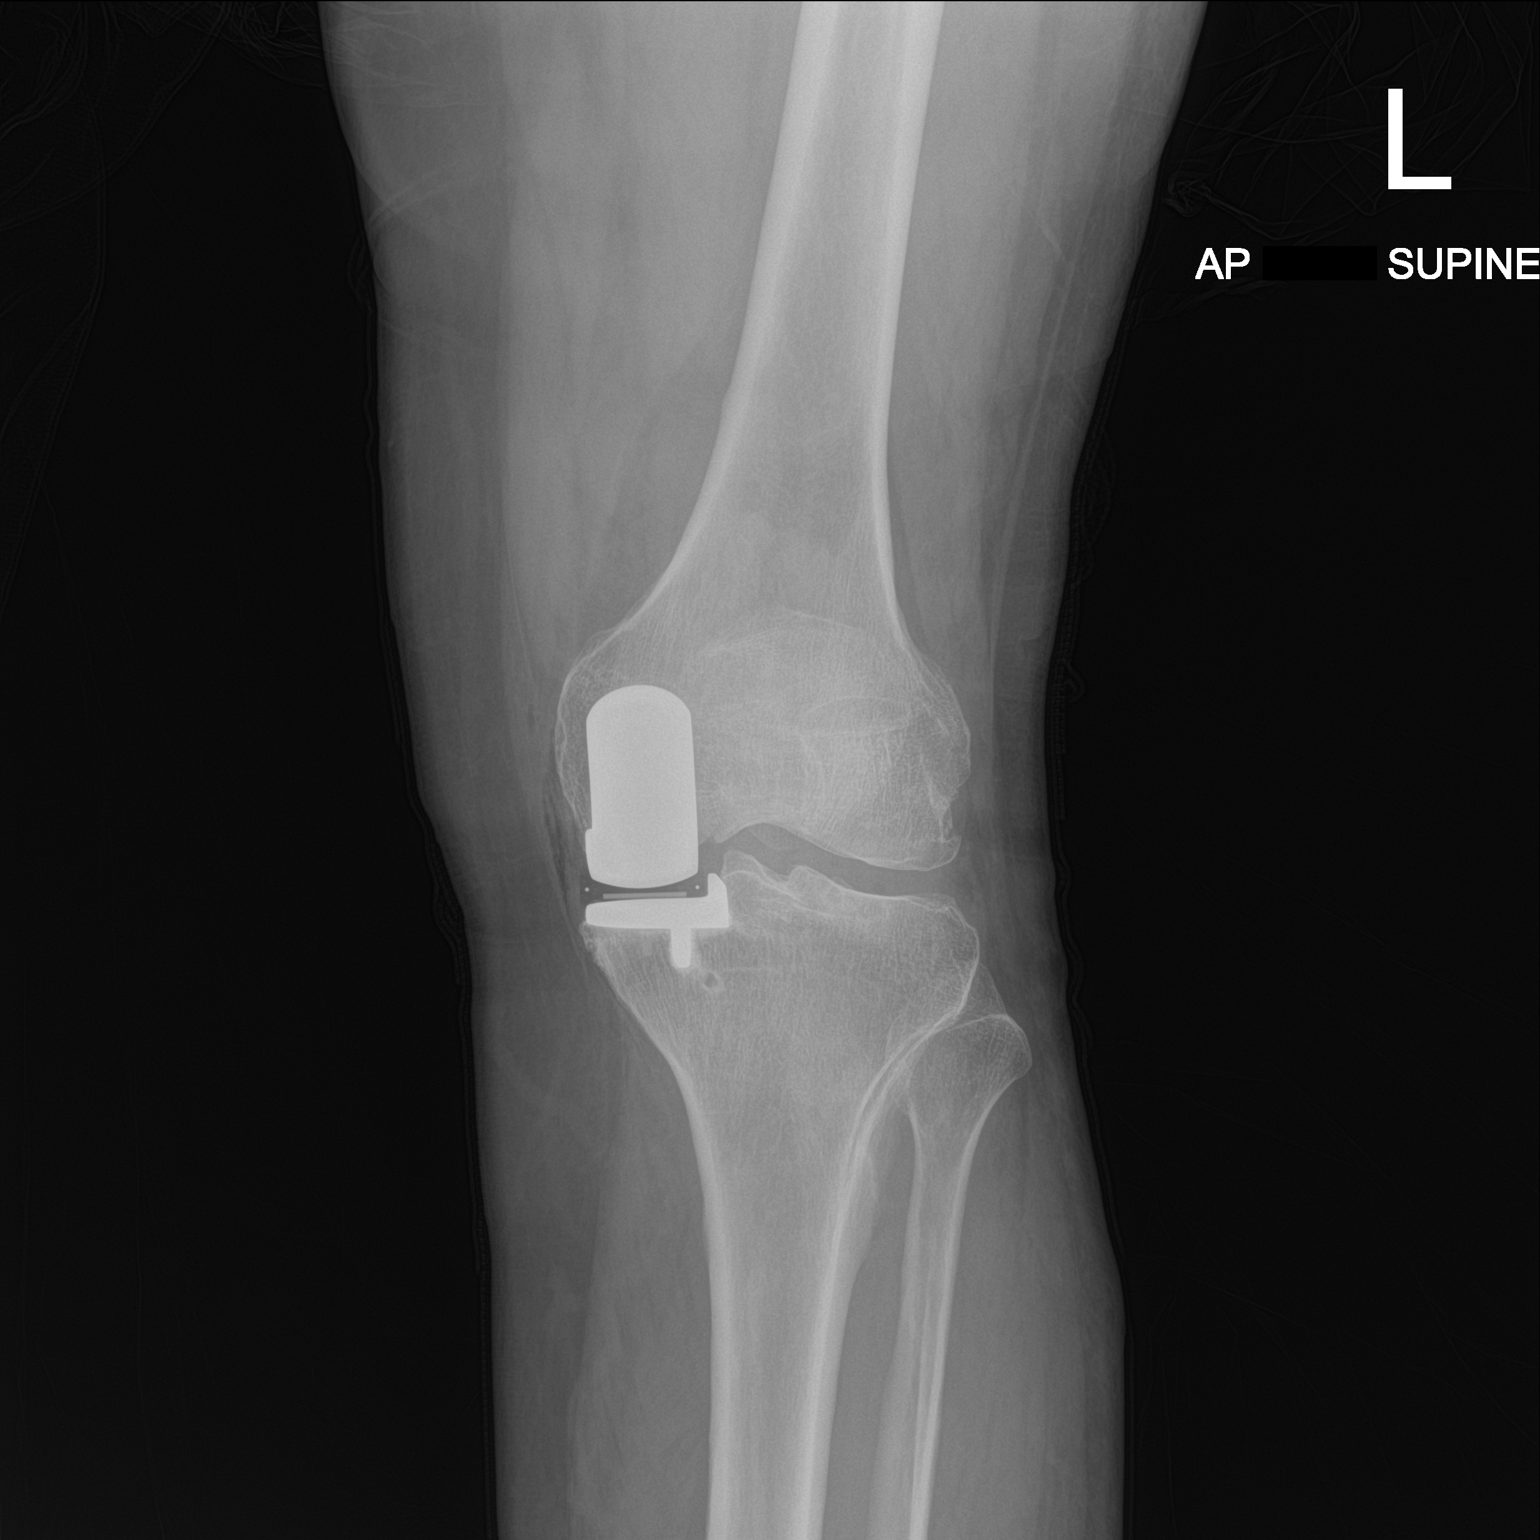
[im 2/2]
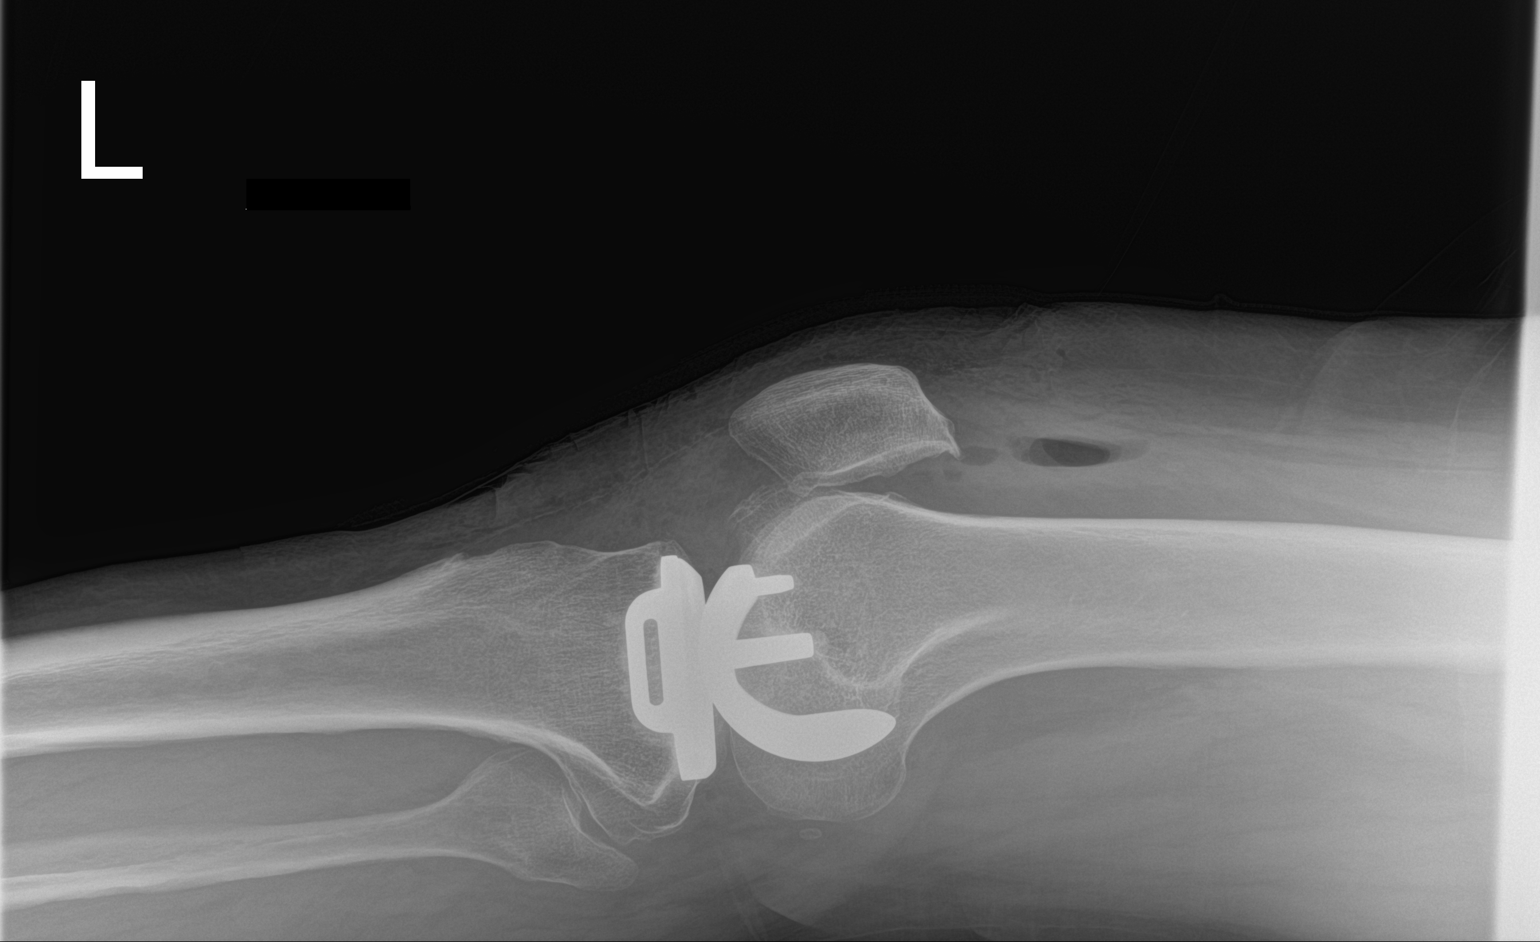

[2 of 2 positions shown; findings below may reference images not displayed]

FINDINGS: Unicompartmental medial compartment left knee replacement has been
performed. The lateral and patellofemoral compartments are
relatively well preserved. A small amount air and fluid is noted in
the joint space postoperatively. No complications are seen.
IMPRESSION: Medial compartment unicompartmental left knee replacement.

## 2020-06-26 NOTE — Therapy (Signed)
[place], [place], [address] Phone: [phone]   Fax:  [phone]  Physical Therapy Treatment  Patient Details  Name: Derek Lara MRN: [id number] Date of Birth: 1951/01/14 Referring Provider (PT): Marchia Bond, MD    Encounter Date: 06/26/2020   PT End of Session - 06/26/20 1450    Visit Number 7    Number of Visits 13    Date for PT Re-Evaluation 07/15/20    Authorization Type UHC [place] visit 6, PN visit 10    Authorization Time Period FOTO at  10th    PT Start Time 1445    PT Stop Time 1526    PT Time Calculation (min) 41 min    Activity Tolerance Patient tolerated treatment well    Behavior During Therapy [facility] for tasks assessed/performed           Past Medical History:  Diagnosis Date  . Diabetes mellitus without complication ([place])    TYPE 2   . Hypertension   . Primary localized osteoarthritis of left knee 02/10/2018    Past Surgical History:  Procedure Laterality Date  . CATARACT EXTRACTION W/ INTRAOCULAR LENS  IMPLANT, BILATERAL    . LEG SURGERY     vascular  . PARTIAL KNEE ARTHROPLASTY Left 02/10/2018   Procedure: LEFT UNICOMPARTMENTAL KNEE;  Surgeon: Marchia Bond, MD;  Location: [place];  Service: Orthopedics;  Laterality: Left;  . PARTIAL KNEE ARTHROPLASTY Right 05/09/2020   Procedure: UNICOMPARTMENTAL KNEE;  Surgeon: Marchia Bond, MD;  Location: [facility];  Service: Orthopedics;  Laterality: Right;    There were no vitals filed for this visit.   Subjective Assessment - 06/26/20 1448    Subjective Pt reports he continues to walk without his cane a little bit at home and only has minor pain/soreness in his R knee.    How long can you sit comfortably? unlimited    How long can you stand comfortably? 30 min    How long can you walk comfortably? 30 min    Diagnostic tests N/A    Patient Stated Goals walk without AD, run on treadmil, go back to gym. drive    Currently  in Pain? Yes    Pain Score 2     Pain Location Knee    Pain Orientation Right    Pain Descriptors / Indicators Aching    Pain Type Chronic pain    Pain Onset More than a month ago              [facility] PT Assessment - 06/26/20 0001      Assessment   Medical Diagnosis s/p Rt medial knee replacement    Referring Provider (PT) Marchia Bond, MD     Onset Date/Surgical Date 05/09/20      AROM   Right Knee Extension -10    Right Knee Flexion 115                         [facility] Adult PT Treatment/Exercise - 06/26/20 0001      Ambulation/Gait   Ambulation Distance (Feet) 180 Feet    Assistive device Straight cane   SPC for 90 feet then no AD for 90 feet   Gait Pattern Step-through pattern      Knee/Hip Exercises: Stretches   Gastroc Stretch Right;3 reps;30 seconds    Gastroc Stretch Limitations slant board on lowest incline setting      Knee/Hip Exercises: Aerobic  Nustep L5 x 6 min UE/LE      Knee/Hip Exercises: Machines for Strengthening   Cybex Knee Extension 15# 2 x 15    Cybex Knee Flexion 25# 2 x 15      Knee/Hip Exercises: Standing   Heel Raises Both;2 sets;20 reps    Heel Raises Limitations 20x on level ground; 20x standing on airex    Terminal Knee Extension Strengthening;Right;20 reps    Terminal Knee Extension Limitations 3# at freemotion    Functional Squat Limitations 2 x 15 with UE support at freemotion while standing on airex    Gait Training Cues for heel strike first with SPC then with no AD      Knee/Hip Exercises: Supine   Quad Sets Strengthening;Right;15 reps    Quad Sets Limitations towel roll under R ankle    Other Supine Knee/Hip Exercises Heel prop on 2 towel rolls with 5# ankle weight proximal to knee x 2 min                  PT Education - 06/26/20 1836    Education Details HEP and symptom management. Reviewed heel prop with lightweight items proximal to knee to promote improved R knee EXT.    Person(s) Educated  Patient    Methods Explanation;Demonstration;Tactile cues;Verbal cues    Comprehension Verbalized understanding;Returned demonstration;Verbal cues required;Tactile cues required            PT Short Term Goals - 06/26/20 1842      PT SHORT TERM GOAL #1   Title ROM 0-120    Baseline 06/26/2020: 10-115 degrees R knee AROM    Time 3    Period Weeks    Status Partially Met    Target Date 06/25/19             PT Long Term Goals - 05/30/20 1400      PT LONG TERM GOAL #1   Title pt will be able to perform sit<>stand without use of UE    Baseline full reliance of UE at eval    Time 6    Period Weeks    Status New    Target Date 07/16/19      PT LONG TERM GOAL #2   Title pt will be able to ambulate for at least 1 hour pain <=2/10 without AD    Baseline reports 8/10 today using RW    Time 6    Period Weeks    Status New    Target Date 07/16/19      PT LONG TERM GOAL #3   Title pt will be able to return to gym and able to demontrate good form with use of equipment    Baseline not going to gym at eval but would like to return    Time 6    Period Weeks    Status New    Target Date 07/16/19      PT LONG TERM GOAL #4   Title pt will report ability to have full control of pressure through Rt foot for safety with walking and driving    Baseline poor control at eval    Time 6    Period Weeks    Status New    Target Date 07/16/19                 Plan - 06/26/20 1451    Clinical Impression Statement Patient had good tolerance to therapy session. He continues to have increased pain with  end range R knee flexion and lacks 10 degrees from full extension. He will benefit from continued skilled PT to address these deficits and allow for more normalized gait pattern and improved tolerance with desired tasks.    Personal Factors and Comorbidities Comorbidity 1    Comorbidities DM    Examination-Activity Limitations Squat;Stairs;Bend;Stand;Locomotion Level;Transfers     Examination-Participation Restrictions Driving    Stability/Clinical Decision Making Stable/Uncomplicated    Rehab Potential Good    PT Frequency 2x / week    PT Duration 6 weeks    PT Treatment/Interventions ADLs/Self Care Home Management;Cryotherapy;Moist Heat;Electrical Stimulation;Gait training;Stair training;Functional mobility training;Neuromuscular re-education;Balance training;Therapeutic exercise;Therapeutic activities;Patient/family education;Manual techniques;Scar mobilization;Passive range of motion;Taping    PT Next Visit Plan progress as tolerated with knee strengthening/closed chain activities, balance/proprioceptive challenges, knee ROM as tolerated, heel slides    PT Home Exercise Plan [id number], gait with SPC- heel/toe    Consulted and Agree with Plan of Care Patient           Patient will benefit from skilled therapeutic intervention in order to improve the following deficits and impairments:  Abnormal gait,Decreased balance,Difficulty walking,Increased muscle spasms,Impaired sensation,Improper body mechanics,Decreased range of motion,Decreased activity tolerance,Decreased strength,Pain,Impaired flexibility  Visit Diagnosis: Stiffness of right knee, not elsewhere classified  Acute pain of right knee  Difficulty in walking, not elsewhere classified  Pain in right leg  Other abnormalities of gait and mobility     Problem List Patient Active Problem List   Diagnosis Date Noted  . S/P right unicompartmental knee replacement 05/09/2020  . Primary localized osteoarthritis of left knee 02/10/2018  . S/P left unicompartmental knee replacement 02/10/2018    Haydee Monica, PT, DPT 06/26/20 6:46 PM  [facility] [address] [place], [place], [address] Phone: [phone]   Fax:  [phone]  Name: Derek Lara MRN: [id number] Date of Birth: February 22, 1951

## 2020-07-03 ENCOUNTER — Ambulatory Visit: Payer: Medicare Other

## 2020-07-12 ENCOUNTER — Encounter: Payer: Self-pay | Admitting: Physical Therapy

## 2020-07-12 ENCOUNTER — Other Ambulatory Visit: Payer: Self-pay

## 2020-07-12 ENCOUNTER — Ambulatory Visit: Payer: Medicare Other | Admitting: Physical Therapy

## 2020-07-12 DIAGNOSIS — M25561 Pain in right knee: Secondary | ICD-10-CM

## 2020-07-12 DIAGNOSIS — M79604 Pain in right leg: Secondary | ICD-10-CM

## 2020-07-12 DIAGNOSIS — M25661 Stiffness of right knee, not elsewhere classified: Secondary | ICD-10-CM | POA: Diagnosis not present

## 2020-07-12 DIAGNOSIS — R2689 Other abnormalities of gait and mobility: Secondary | ICD-10-CM

## 2020-07-12 DIAGNOSIS — R262 Difficulty in walking, not elsewhere classified: Secondary | ICD-10-CM

## 2020-07-12 NOTE — Therapy (Addendum)
[place], [place], [address] Phone: [phone]   Fax:  [phone]  Physical Therapy Treatment/Progress Note  Patient Details  Name: Derek Lara MRN: [id number] Date of Birth: Jan 02, 1951 Referring Provider (PT): Marchia Bond, MD   Progress Note Reporting Period 05/30/2021 to 07/12/2020  See note below for Objective Data and Assessment of Progress/Goals.       Encounter Date: 07/12/2020   PT End of Session - 07/12/20 1604    Visit Number 8    Number of Visits 16    Date for PT Re-Evaluation 08/09/20    Authorization Type UHC MCR- F[place] visit 6, PN visit 10    PT Start Time 1545    PT Stop Time 1625    PT Time Calculation (min) 40 min    Activity Tolerance Patient tolerated treatment well    Behavior During Therapy [facility] for tasks assessed/performed           Past Medical History:  Diagnosis Date  . 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 Hypertension   . Primary localized osteoarthritis of left knee 02/10/2018    Past Surgical History:  Procedure Laterality Date  . CATARACT EXTRACTION W/ INTRAOCULAR LENS  IMPLANT, BILATERAL    . LEG SURGERY     vascular  . PARTIAL KNEE ARTHROPLASTY Left 02/10/2018   Procedure: LEFT UNICOMPARTMENTAL KNEE;  Surgeon: Marchia Bond, MD;  Location: [place];  Service: Orthopedics;  Laterality: Left;  . PARTIAL KNEE ARTHROPLASTY Right 05/09/2020   Procedure: UNICOMPARTMENTAL KNEE;  Surgeon: Marchia Bond, MD;  Location[facility];  Service: Orthopedics;  Laterality: Right;    There were no vitals filed for this visit.   Subjective Assessment - 07/12/20 1549    Subjective Patient continues to have some ankle pain. He reports minor pain in the front of his right knee at this tim but it isn;t bad. He is not using his cane. he feels like his walking is off.    How long can you sit comfortably? unlimited    How long can you stand  comfortably? 30 min    How long can you walk comfortably? 30 min    Diagnostic tests N/A    Patient Stated Goals walk without AD, run on treadmil, go back to gym. drive    Currently in Pain? Yes    Pain Score 2     Pain Location Knee    Pain Orientation Right    Pain Descriptors / Indicators Aching    Pain Type Chronic pain    Pain Onset More than a month ago    Pain Frequency Intermittent    Aggravating Factors  walking    Pain Relieving Factors ice    Effect of Pain on Daily Activities pain while driving              [facility] PT Assessment - 07/12/20 0001      Observation/Other Assessments   Focus on Therapeutic Outcomes (FOTO)  77      AROM   Right Knee Extension -5    Right Knee Flexion 115      Strength   Right Hip Flexion 4+/5    Right Hip ABduction 4+/5    Right Hip ADduction 4+/5    Left Hip ABduction 4+/5    Right Knee Flexion 5/5    Right Knee Extension 5/5    Left Knee Flexion 5/5    Left Knee Extension 5/5  [facility] Adult PT Treatment/Exercise - 07/12/20 0001      Knee/Hip Exercises: Standing   Heel Raises Both;2 sets;20 reps    Lateral Step Up 15 reps;Step Height: 6"    Forward Step Up 15 reps;Step Height: 6"    Step Down Step Height: 4";15 reps    Functional Squat Limitations 2 x 15 with UE support at freemotion while standing on airex      Knee/Hip Exercises: Supine   Straight Leg Raises Limitations 2x10      Manual Therapy   Manual Therapy Joint mobilization    Manual therapy comments trigger point release to posterior knee; manual trigger point release to medial gastroc    Joint Mobilization PA and AP glides grade II and III    Soft tissue mobilization to postierior knee and IT band to improve extension; to medial calf                  PT Education - 07/12/20 1551    Education Details gait training and technique with standing exercises    Person(s) Educated Patient    Methods  Explanation;Demonstration;Verbal cues;Tactile cues    Comprehension Verbalized understanding;Returned demonstration;Verbal cues required;Tactile cues required            PT Short Term Goals - 06/26/20 1842      PT SHORT TERM GOAL #1   Title ROM 0-120    Baseline 06/26/2020: 10-115 degrees R knee AROM    Time 3    Period Weeks    Status Partially Met    Target Date 06/25/19             PT Long Term Goals - 05/30/20 1400      PT LONG TERM GOAL #1   Title pt will be able to perform sit<>stand without use of UE    Baseline full reliance of UE at eval    Time 6    Period Weeks    Status New    Target Date 07/16/19      PT LONG TERM GOAL #2   Title pt will be able to ambulate for at least 1 hour pain <=2/10 without AD    Baseline reports 8/10 today using RW    Time 6    Period Weeks    Status New    Target Date 07/16/19      PT LONG TERM GOAL #3   Title pt will be able to return to gym and able to demontrate good form with use of equipment    Baseline not going to gym at eval but would like to return    Time 6    Period Weeks    Status New    Target Date 07/16/19      PT LONG TERM GOAL #4   Title pt will report ability to have full control of pressure through Rt foot for safety with walking and driving    Baseline poor control at eval    Time 6    Period Weeks    Status New    Target Date 07/16/19                 Plan - 07/12/20 1612    Clinical Impression Statement Patient is progressing well. He had such a good outcome with his left knee that it is making him think that he is behind on the right but, he is progressing as would be expected. He is still lacking 5 degrees of extension  but it  has improved over the past few visits.His flexion was measured at  He has decreased single leg stance on the left without the cane. he has just recently moved to ambualtion without the cane. He was encouraged to continue to be dilligent with his exercises and his gait  will improve. He continues to have pain in his calf as he did prior to his surgery. It is unclear where this pain is coming from. He has good ankle movement and very little pain with palpation of the area. he had some improvment prior to surgery. We will continue 1W6 to improve gait and continue to progress patient towards functional goals. The patients FOTO score has improved significantly.    Personal Factors and Comorbidities Comorbidity 1    Comorbidities DM    Examination-Activity Limitations Squat;Stairs;Bend;Stand;Locomotion Level;Transfers    Stability/Clinical Decision Making Stable/Uncomplicated    Clinical Decision Making Low    Rehab Potential Good    PT Frequency 2x / week    PT Duration 4 weeks    PT Treatment/Interventions ADLs/Self Care Home Management;Cryotherapy;Moist Heat;Electrical Stimulation;Gait training;Stair training;Functional mobility training;Neuromuscular re-education;Balance training;Therapeutic exercise;Therapeutic activities;Patient/family education;Manual techniques;Scar mobilization;Passive range of motion;Taping    PT Next Visit Plan progress as tolerated with knee strengthening/closed chain activities, balance/proprioceptive challenges, knee ROM as tolerated, heel slides    PT Home Exercise Plan [id number], gait with SPC- heel/toe    Consulted and Agree with Plan of Care Patient           Patient will benefit from skilled therapeutic intervention in order to improve the following deficits and impairments:  Abnormal gait,Decreased balance,Difficulty walking,Increased muscle spasms,Impaired sensation,Improper body mechanics,Decreased range of motion,Decreased activity tolerance,Decreased strength,Pain,Impaired flexibility  Visit Diagnosis: Stiffness of right knee, not elsewhere classified  Acute pain of right knee  Difficulty in walking, not elsewhere classified  Pain in right leg  Other abnormalities of gait and mobility     Problem List Patient  Active Problem List   Diagnosis Date Noted  . S/P right unicompartmental knee replacement 05/09/2020  . Primary localized osteoarthritis of left knee 02/10/2018  . S/P left unicompartmental knee replacement 02/10/2018    Carney Living PT DPT  07/12/2020, 4:36 PM  [facility] [address] [place], [place], [address] Phone: [phone]   Fax:  [phone]  Name: Sabastian Raimondi MRN: [id number] Date of Birth: Apr 22, 1951

## 2020-07-13 ENCOUNTER — Ambulatory Visit: Payer: Medicare Other | Admitting: Physical Therapy

## 2020-07-13 ENCOUNTER — Other Ambulatory Visit: Payer: Self-pay

## 2020-07-13 ENCOUNTER — Encounter: Payer: Self-pay | Admitting: Physical Therapy

## 2020-07-13 DIAGNOSIS — R262 Difficulty in walking, not elsewhere classified: Secondary | ICD-10-CM

## 2020-07-13 DIAGNOSIS — M25561 Pain in right knee: Secondary | ICD-10-CM

## 2020-07-13 DIAGNOSIS — M79604 Pain in right leg: Secondary | ICD-10-CM

## 2020-07-13 DIAGNOSIS — M25661 Stiffness of right knee, not elsewhere classified: Secondary | ICD-10-CM | POA: Diagnosis not present

## 2020-07-13 DIAGNOSIS — R2689 Other abnormalities of gait and mobility: Secondary | ICD-10-CM

## 2020-07-13 NOTE — Therapy (Signed)
[place], [place], [address] Phone: [phone]   Fax:  [phone]  Physical Therapy Treatment  Patient Details  Name: Derek Lara MRN: [id number] Date of Birth: 14-Jun-1951 Referring Provider (PT): Marchia Bond, MD    Encounter Date: 07/13/2020   PT End of Session - 07/13/20 0847    Visit Number 9    Number of Visits 16    Date for PT Re-Evaluation 08/09/20    Authorization Type UHC MCR, next progress note at visit 18    Authorization Time Period --    PT Start Time 0844    PT Stop Time 0927    PT Time Calculation (min) 43 min    Activity Tolerance Patient tolerated treatment well    Behavior During Therapy [facility] for tasks assessed/performed           Past Medical History:  Diagnosis Date  . Diabetes mellitus without complication ([place])    TYPE 2   . Hypertension   . Primary localized osteoarthritis of left knee 02/10/2018    Past Surgical History:  Procedure Laterality Date  . CATARACT EXTRACTION W/ INTRAOCULAR LENS  IMPLANT, BILATERAL    . LEG SURGERY     vascular  . PARTIAL KNEE ARTHROPLASTY Left 02/10/2018   Procedure: LEFT UNICOMPARTMENTAL KNEE;  Surgeon: Marchia Bond, MD;  Location: [place];  Service: Orthopedics;  Laterality: Left;  . PARTIAL KNEE ARTHROPLASTY Right 05/09/2020   Procedure: UNICOMPARTMENTAL KNEE;  Surgeon: Marchia Bond, MD;  Location[facility];  Service: Orthopedics;  Laterality: Right;    There were no vitals filed for this visit.   Subjective Assessment - 07/13/20 0846    Subjective Right knee pain 2/10 this AM (front of knee). No significant ankle pain this AM.    Currently in Pain? Yes    Pain Score 2     Pain Location Knee    Pain Orientation Right    Pain Descriptors / Indicators Aching    Pain Type Chronic pain    Pain Onset More than a month ago    Pain Frequency Intermittent    Aggravating Factors  walking    Pain Relieving Factors ice     Effect of Pain on Daily Activities pain with driving                             [facility] Adult PT Treatment/Exercise - 07/13/20 0001      Knee/Hip Exercises: Stretches   Passive Hamstring Stretch Right;3 reps;30 seconds    Other Knee/Hip Stretches slant board stretch 3x20 sec      Knee/Hip Exercises: Aerobic   Nustep L5 x 5 min UE/LE      Knee/Hip Exercises: Machines for Strengthening   Cybex Knee Extension 15 lbs. 2x10    Cybex Knee Flexion 25 lbs. 2x10    Cybex Leg Press 55 lbs. 2x10      Knee/Hip Exercises: Standing   Forward Lunges Right;2 sets;10 reps    Forward Lunges Limitations front partial lunge at counter 2x10    Lateral Step Up 15 reps;Step Height: 6"    Forward Step Up 15 reps;Step Height: 6"    Step Down Step Height: 4";15 reps    Functional Squat Limitations TRX squat 2x15    Rocker Board 1 minute    Rocker Board Limitations dynamic lateral balance with blue circle board    SLS 5 x 10 sec holds on Airex  intermittent UE support on counter      Manual Therapy   Passive ROM right knee extension and flexion in supine                    PT Short Term Goals - 06/26/20 1842      PT SHORT TERM GOAL #1   Title ROM 0-120    Baseline 06/26/2020: 10-115 degrees R knee AROM    Time 3    Period Weeks    Status Partially Met    Target Date 06/25/19             PT Long Term Goals - 05/30/20 1400      PT LONG TERM GOAL #1   Title pt will be able to perform sit<>stand without use of UE    Baseline full reliance of UE at eval    Time 6    Period Weeks    Status New    Target Date 07/16/19      PT LONG TERM GOAL #2   Title pt will be able to ambulate for at least 1 hour pain <=2/10 without AD    Baseline reports 8/10 today using RW    Time 6    Period Weeks    Status New    Target Date 07/16/19      PT LONG TERM GOAL #3   Title pt will be able to return to gym and able to demontrate good form with use of equipment    Baseline  not going to gym at eval but would like to return    Time 6    Period Weeks    Status New    Target Date 07/16/19      PT LONG TERM GOAL #4   Title pt will report ability to have full control of pressure through Rt foot for safety with walking and driving    Baseline poor control at eval    Time 6    Period Weeks    Status New    Target Date 07/16/19                 Plan - 07/13/20 0849    Clinical Impression Statement Continued progression closed chain strengthening along with knee ROM. As noted yesterday pt. continues to progress with objective status for knee ROM and strength and functional status for ambulation without AD. He still has difficulty with stair navigation with inability to ascend stairs with reciprocal gait-still some decreased eccentric quad control noted iwth closed chain activities and for gait tendency to externally rotate right foot (cues for walking in clinic with heel strike and neutral foot position). Plan continue PT for further progress ot address remaining functional limitations.    Personal Factors and Comorbidities Comorbidity 1    Comorbidities DM    Examination-Activity Limitations Squat;Stairs;Bend;Stand;Locomotion Level;Transfers    Examination-Participation Restrictions Driving    Stability/Clinical Decision Making Stable/Uncomplicated    Clinical Decision Making Low    Rehab Potential Good    PT Frequency 2x / week    PT Duration 4 weeks    PT Treatment/Interventions ADLs/Self Care Home Management;Cryotherapy;Moist Heat;Electrical Stimulation;Gait training;Stair training;Functional mobility training;Neuromuscular re-education;Balance training;Therapeutic exercise;Therapeutic activities;Patient/family education;Manual techniques;Scar mobilization;Passive range of motion;Taping    PT Next Visit Plan progress as tolerated with knee strengthening/closed chain activities, balance/proprioceptive challenges, knee ROM as tolerated, heel slides    PT  Home Exercise Plan [id number], gait with SPC- heel/toe    Consulted and Agree  with Plan of Care Patient           Patient will benefit from skilled therapeutic intervention in order to improve the following deficits and impairments:  Abnormal gait,Decreased balance,Difficulty walking,Increased muscle spasms,Impaired sensation,Improper body mechanics,Decreased range of motion,Decreased activity tolerance,Decreased strength,Pain,Impaired flexibility  Visit Diagnosis: Stiffness of right knee, not elsewhere classified  Acute pain of right knee  Difficulty in walking, not elsewhere classified  Pain in right leg  Other abnormalities of gait and mobility     Problem List Patient Active Problem List   Diagnosis Date Noted  . S/P right unicompartmental knee replacement 05/09/2020  . Primary localized osteoarthritis of left knee 02/10/2018  . S/P left unicompartmental knee replacement 02/10/2018   Beaulah Dinning, PT, DPT 07/13/20 9:30 AM  [place], [place], [address] Phone: [phone]   Fax:  [phone]  Name: Derek Lara MRN: [id number] Date of Birth: 28-Dec-1950

## 2020-07-19 ENCOUNTER — Other Ambulatory Visit: Payer: Self-pay

## 2020-07-19 ENCOUNTER — Ambulatory Visit: Payer: Medicare Other | Attending: Orthopedic Surgery

## 2020-07-19 DIAGNOSIS — M25561 Pain in right knee: Secondary | ICD-10-CM | POA: Insufficient documentation

## 2020-07-19 DIAGNOSIS — R2689 Other abnormalities of gait and mobility: Secondary | ICD-10-CM | POA: Diagnosis present

## 2020-07-19 DIAGNOSIS — M25661 Stiffness of right knee, not elsewhere classified: Secondary | ICD-10-CM | POA: Diagnosis present

## 2020-07-19 DIAGNOSIS — R262 Difficulty in walking, not elsewhere classified: Secondary | ICD-10-CM | POA: Insufficient documentation

## 2020-07-19 DIAGNOSIS — M79604 Pain in right leg: Secondary | ICD-10-CM | POA: Insufficient documentation

## 2020-07-19 NOTE — Therapy (Signed)
[place], [place], [address] Phone: [phone]   Fax:  [phone]  Physical Therapy Treatment  Patient Details  Name: Derek Lara MRN: [id number] Date of Birth: [birth year] Referring Provider (PT): Marchia Bond, MD    Encounter Date: 07/19/2020   PT End of Session - 07/19/20 1042    Visit Number 10    Number of Visits 16    Date for PT Re-Evaluation 08/09/20    Authorization Type UHC MCR, next progress note at visit 18    PT Start Time 1045    PT Stop Time 1124    PT Time Calculation (min) 39 min    Activity Tolerance Patient tolerated treatment well    Behavior During Therapy [facility] for tasks assessed/performed           Past Medical History:  Diagnosis Date  . Diabetes mellitus without complication ([place])    TYPE 2   . Hypertension   . Primary localized osteoarthritis of left knee 02/10/2018    Past Surgical History:  Procedure Laterality Date  . CATARACT EXTRACTION W/ INTRAOCULAR LENS  IMPLANT, BILATERAL    . LEG SURGERY     vascular  . PARTIAL KNEE ARTHROPLASTY Left 02/10/2018   Procedure: LEFT UNICOMPARTMENTAL KNEE;  Surgeon: Marchia Bond, MD;  Location: [place];  Service: Orthopedics;  Laterality: Left;  . PARTIAL KNEE ARTHROPLASTY Right 05/09/2020   Procedure: UNICOMPARTMENTAL KNEE;  Surgeon: Marchia Bond, MD;  Location: [facility];  Service: Orthopedics;  Laterality: Right;    There were no vitals filed for this visit.   Subjective Assessment - 07/19/20 1042    Subjective Pt reports feeling "good" and has 2/10 anterior R knee pain.    How long can you sit comfortably? unlimited    How long can you stand comfortably? 30 min    How long can you walk comfortably? 30 min    Diagnostic tests N/A    Patient Stated Goals walk without AD, run on treadmil, go back to gym. drive    Currently in Pain? Yes    Pain Score 2     Pain Location Knee    Pain Orientation Right    Pain  Descriptors / Indicators Aching    Pain Type Chronic pain    Pain Onset More than a month ago              [facility] PT Assessment - 07/19/20 0001      Assessment   Medical Diagnosis s/p Rt medial knee replacement    Referring Provider (PT) Marchia Bond, MD     Onset Date/Surgical Date 05/09/20      Observation/Other Assessments   Focus on Therapeutic Outcomes (FOTO)  51                         [facility] Adult PT Treatment/Exercise - 07/19/20 0001      Ambulation/Gait   Stairs Yes    Stairs Assistance 6: Modified independent (Device/Increase time)   increased time and use of handrails as needed   Stair Management Technique Alternating pattern;Step to pattern    Number of Stairs --   4" x 4 ascending/descending then 6" x 1   Height of Stairs --   4" then 6"   Pre-Gait Activities Stairs: Practiced reciprocal step pattern on 4" steps ascending/descending x 4 each with emphasis on R quad eccentric control when descending. Handrails as needed - primarily reached for one  hand rail and encouraged not to push/pull himself on/off steps using UE.    Gait Comments Gait in parallel bars to allow for visual cues using mirror and cues for heel-to-toe pattern x 2 back and forth      Knee/Hip Exercises: Aerobic   Nustep L5 x 5 min UE/LE      Knee/Hip Exercises: Machines for Strengthening   Cybex Knee Extension 25# x 20    Cybex Knee Flexion 35# x 20    Cybex Leg Press 55# x 20      Knee/Hip Exercises: Standing   Hip Abduction Stengthening;Both;15 reps;Knee straight    Lateral Step Up --    Lateral Step Up Limitations --    Forward Step Up Right;20 reps;Step Height: 6"    Step Down Left;20 reps;Step Height: 6"    Step Down Limitations UE support at freemotion with focus on R quad eccentric control                  PT Education - 07/19/20 1334    Education Details Gait and stair training; glute med and eccentric quad control/strengthening    Person(s) Educated  Patient    Methods Explanation;Demonstration;Tactile cues;Verbal cues    Comprehension Verbalized understanding;Returned demonstration;Verbal cues required;Tactile cues required            PT Short Term Goals - 07/19/20 1337      PT SHORT TERM GOAL #1   Title ROM 0-120    Baseline 06/26/2020: 10-115 degrees R knee AROM. 07/12/2020: 5-115    Time 3    Period Weeks    Status Partially Met    Target Date 06/25/19             PT Long Term Goals - 07/19/20 1339      PT LONG TERM GOAL #1   Title pt will be able to perform sit<>stand without use of UE    Baseline full reliance of UE at eval    Time 6    Period Weeks    Status Partially Met      PT LONG TERM GOAL #2   Title pt will be able to ambulate for at least 1 hour pain <=2/10 without AD    Baseline reports 8/10 today using RW    Time 6    Period Weeks    Status On-going      PT LONG TERM GOAL #3   Title pt will be able to return to gym and able to demontrate good form with use of equipment    Baseline not going to gym at eval but would like to return    Time 6    Period Weeks    Status On-going      PT LONG TERM GOAL #4   Title pt will report ability to have full control of pressure through Rt foot for safety with walking and driving    Baseline poor control at eval    Time 6    Period Weeks    Status Partially Met                 Plan - 07/19/20 1043    Clinical Impression Statement Patient did well with gait and stair training today but continues to demonstrate decreased eccentric control of R quad. He was able to increase resistance while performing knee EXT on cybex today with no adverse effects or complaints of pain. He should continue to benefit from skilled PT to further improve  functional strength and mobility and return to optimal gait pattern.    Personal Factors and Comorbidities Comorbidity 1    Comorbidities DM    Examination-Activity Limitations Squat;Stairs;Bend;Stand;Locomotion  Level;Transfers    Examination-Participation Restrictions Driving    Stability/Clinical Decision Making Stable/Uncomplicated    Rehab Potential Good    PT Frequency 2x / week    PT Duration 4 weeks    PT Treatment/Interventions ADLs/Self Care Home Management;Cryotherapy;Moist Heat;Electrical Stimulation;Gait training;Stair training;Functional mobility training;Neuromuscular re-education;Balance training;Therapeutic exercise;Therapeutic activities;Patient/family education;Manual techniques;Scar mobilization;Passive range of motion;Taping    PT Next Visit Plan progress as tolerated with knee strengthening/closed chain activities, balance/proprioceptive challenges, knee ROM as tolerated, heel slides    PT Home Exercise Plan [id number], gait with SPC- heel/toe    Consulted and Agree with Plan of Care Patient           Patient will benefit from skilled therapeutic intervention in order to improve the following deficits and impairments:  Abnormal gait,Decreased balance,Difficulty walking,Increased muscle spasms,Impaired sensation,Improper body mechanics,Decreased range of motion,Decreased activity tolerance,Decreased strength,Pain,Impaired flexibility  Visit Diagnosis: Stiffness of right knee, not elsewhere classified  Acute pain of right knee  Difficulty in walking, not elsewhere classified  Pain in right leg  Other abnormalities of gait and mobility     Problem List Patient Active Problem List   Diagnosis Date Noted  . S/P right unicompartmental knee replacement 05/09/2020  . Primary localized osteoarthritis of left knee 02/10/2018  . S/P left unicompartmental knee replacement 02/10/2018     Haydee Monica, PT, DPT 07/19/20 1:40 PM  [facility] [address] [place], [place], [address] Phone: [phone]   Fax:  [phone]  Name: Derek Lara MRN: [id number] Date of Birth: September 29, 1950

## 2020-07-21 ENCOUNTER — Other Ambulatory Visit: Payer: Self-pay

## 2020-07-21 ENCOUNTER — Ambulatory Visit: Payer: Medicare Other | Admitting: Physical Therapy

## 2020-07-21 ENCOUNTER — Encounter: Payer: Self-pay | Admitting: Physical Therapy

## 2020-07-21 DIAGNOSIS — R262 Difficulty in walking, not elsewhere classified: Secondary | ICD-10-CM

## 2020-07-21 DIAGNOSIS — M79604 Pain in right leg: Secondary | ICD-10-CM

## 2020-07-21 DIAGNOSIS — M25561 Pain in right knee: Secondary | ICD-10-CM

## 2020-07-21 DIAGNOSIS — R2689 Other abnormalities of gait and mobility: Secondary | ICD-10-CM

## 2020-07-21 DIAGNOSIS — M25661 Stiffness of right knee, not elsewhere classified: Secondary | ICD-10-CM

## 2020-07-21 NOTE — Therapy (Signed)
[place], [place], [address] Phone: [phone]   Fax:  [phone]  Physical Therapy Treatment  Patient Details  Name: Derek Lara MRN: [id number] Date of Birth: 1950-12-31 Referring Provider (PT): Marchia Bond, MD    Encounter Date: 07/21/2020   PT End of Session - 07/21/20 1318    Visit Number 11    Number of Visits 16    Date for PT Re-Evaluation 08/09/20    Authorization Type UHC MCR, next progress note at visit 18    Authorization Time Period FOTO at  10th    Progress Note Due on Visit 10    PT Start Time 1230    PT Stop Time 1320    PT Time Calculation (min) 50 min           Past Medical History:  Diagnosis Date  . Diabetes mellitus without complication ([place])    TYPE 2   . Hypertension   . Primary localized osteoarthritis of left knee 02/10/2018    Past Surgical History:  Procedure Laterality Date  . CATARACT EXTRACTION W/ INTRAOCULAR LENS  IMPLANT, BILATERAL    . LEG SURGERY     vascular  . PARTIAL KNEE ARTHROPLASTY Left 02/10/2018   Procedure: LEFT UNICOMPARTMENTAL KNEE;  Surgeon: Marchia Bond, MD;  Location: [place];  Service: Orthopedics;  Laterality: Left;  . PARTIAL KNEE ARTHROPLASTY Right 05/09/2020   Procedure: UNICOMPARTMENTAL KNEE;  Surgeon: Marchia Bond, MD;  Location[facility];  Service: Orthopedics;  Laterality: Right;    There were no vitals filed for this visit.   Subjective Assessment - 07/21/20 1234    Subjective Pt reports knee is a 2/10. I am having a little problem with walking. My leg turns out.    Currently in Pain? Yes    Pain Score 2     Pain Location Knee    Pain Orientation Right    Pain Descriptors / Indicators Aching    Pain Type Chronic pain    Aggravating Factors  prolonged sitting    Pain Relieving Factors ice              [facility] PT Assessment - 07/21/20 0001      AROM   Right Knee Flexion 116                          [facility] Adult PT Treatment/Exercise - 07/21/20 0001      Knee/Hip Exercises: Stretches   Active Hamstring Stretch Limitations seated EOM x 3      Knee/Hip Exercises: Aerobic   Nustep L5 x 5 min UE/LE      Knee/Hip Exercises: Standing   Heel Raises 20 reps    Heel Raises Limitations on airex,    Hip Flexion Limitations on airex x 20    Forward Lunges Right;2 sets;10 reps;Left    Forward Lunges Limitations front partial lunge at counter 2x10    Hip Abduction Stengthening;Both;15 reps;Knee straight    Lateral Step Up 20 reps;Step Height: 6";Hand Hold: 1;Right    Forward Step Up Right;20 reps;Step Height: 6"    SLS 5 x 10 sec holds on Airex intermittent UE support on counter      Knee/Hip Exercises: Seated   Long Arc Quad 20 reps    Long Arc Quad Limitations green bnad    Hamstring Curl 20 reps    Hamstring Limitations green    Sit to [employer] 20 reps  Knee/Hip Exercises: Supine   Straight Leg Raises Limitations 2x10      Modalities   Modalities Cryotherapy      Cryotherapy   Number Minutes Cryotherapy 8 Minutes    Cryotherapy Location Knee    Type of Cryotherapy Ice pack                    PT Short Term Goals - 07/19/20 1337      PT SHORT TERM GOAL #1   Title ROM 0-120    Baseline 06/26/2020: 10-115 degrees R knee AROM. 07/12/2020: 5-115    Time 3    Period Weeks    Status Partially Met    Target Date 06/25/19             PT Long Term Goals - 07/19/20 1339      PT LONG TERM GOAL #1   Title pt will be able to perform sit<>stand without use of UE    Baseline full reliance of UE at eval    Time 6    Period Weeks    Status Partially Met      PT LONG TERM GOAL #2   Title pt will be able to ambulate for at least 1 hour pain <=2/10 without AD    Baseline reports 8/10 today using RW    Time 6    Period Weeks    Status On-going      PT LONG TERM GOAL #3   Title pt will be able to return to gym and able to demontrate good  form with use of equipment    Baseline not going to gym at eval but would like to return    Time 6    Period Weeks    Status On-going      PT LONG TERM GOAL #4   Title pt will report ability to have full control of pressure through Rt foot for safety with walking and driving    Baseline poor control at eval    Time 6    Period Weeks    Status Partially Met                 Plan - 07/21/20 1322    Clinical Impression Statement Continued with functional strenthening and balance. He is unable to maintain SLS on airex pad without UE touch. He had increased pain with lateral step ups located at anterior right knee.    PT Next Visit Plan progress as tolerated with knee strengthening/closed chain activities, balance/proprioceptive challenges, knee ROM as tolerated, heel slides    PT Home Exercise Plan [id number], gait with SPC- heel/toe           Patient will benefit from skilled therapeutic intervention in order to improve the following deficits and impairments:  Abnormal gait,Decreased balance,Difficulty walking,Increased muscle spasms,Impaired sensation,Improper body mechanics,Decreased range of motion,Decreased activity tolerance,Decreased strength,Pain,Impaired flexibility  Visit Diagnosis: Stiffness of right knee, not elsewhere classified  Acute pain of right knee  Difficulty in walking, not elsewhere classified  Pain in right leg  Other abnormalities of gait and mobility     Problem List Patient Active Problem List   Diagnosis Date Noted  . S/P right unicompartmental knee replacement 05/09/2020  . Primary localized osteoarthritis of left knee 02/10/2018  . S/P left unicompartmental knee replacement 02/10/2018    Dorene Ar, PTA 07/21/2020, 1:24 PM  [facility] [address] [place], [place], [address] Phone: [phone]   Fax:  [phone]  Name: Derek Lara MRN: [id number] Date of Birth:  October 12, 1950

## 2020-07-26 ENCOUNTER — Encounter: Payer: Self-pay | Admitting: Physical Therapy

## 2020-07-26 ENCOUNTER — Ambulatory Visit: Payer: Medicare Other | Admitting: Physical Therapy

## 2020-07-26 ENCOUNTER — Other Ambulatory Visit: Payer: Self-pay

## 2020-07-26 DIAGNOSIS — R2689 Other abnormalities of gait and mobility: Secondary | ICD-10-CM

## 2020-07-26 DIAGNOSIS — M79604 Pain in right leg: Secondary | ICD-10-CM

## 2020-07-26 DIAGNOSIS — M25661 Stiffness of right knee, not elsewhere classified: Secondary | ICD-10-CM | POA: Diagnosis not present

## 2020-07-26 DIAGNOSIS — R262 Difficulty in walking, not elsewhere classified: Secondary | ICD-10-CM

## 2020-07-26 DIAGNOSIS — M25561 Pain in right knee: Secondary | ICD-10-CM

## 2020-07-26 NOTE — Therapy (Signed)
[place], [place], [address] Phone: [phone]   Fax:  [phone]  Physical Therapy Treatment  Patient Details  Name: Derek Lara MRN: [id number] Date of Birth: 14-Sep-1950 Referring Provider (PT): Marchia Bond, MD    Encounter Date: 07/26/2020   PT End of Session - 07/26/20 1139    Visit Number 12    Number of Visits 16    Date for PT Re-Evaluation 08/09/20    Authorization Type UHC MCR, next progress note at visit 18    Authorization Time Period FOTO at  10th    Progress Note Due on Visit 10    PT Start Time 1102    PT Stop Time 1144    PT Time Calculation (min) 42 min           Past Medical History:  Diagnosis Date  . Diabetes mellitus without complication ([place])    TYPE 2   . Hypertension   . Primary localized osteoarthritis of left knee 02/10/2018    Past Surgical History:  Procedure Laterality Date  . CATARACT EXTRACTION W/ INTRAOCULAR LENS  IMPLANT, BILATERAL    . LEG SURGERY     vascular  . PARTIAL KNEE ARTHROPLASTY Left 02/10/2018   Procedure: LEFT UNICOMPARTMENTAL KNEE;  Surgeon: Marchia Bond, MD;  Location: [place];  Service: Orthopedics;  Laterality: Left;  . PARTIAL KNEE ARTHROPLASTY Right 05/09/2020   Procedure: UNICOMPARTMENTAL KNEE;  Surgeon: Marchia Bond, MD;  Location[facility];  Service: Orthopedics;  Laterality: Right;    There were no vitals filed for this visit.   Subjective Assessment - 07/26/20 1112    Subjective Pt reports 2/10 pain located at proximal right knee.    Currently in Pain? Yes    Pain Score 2     Pain Location Knee    Pain Orientation Right    Pain Descriptors / Indicators Aching    Pain Type Chronic pain    Aggravating Factors  prolonged sitting    Pain Relieving Factors ice              [facility] PT Assessment - 07/26/20 0001      AROM   Right Knee Flexion 121                         [facility] Adult PT  Treatment/Exercise - 07/26/20 0001      Knee/Hip Exercises: Stretches   Active Hamstring Stretch Limitations seated EOM x 3      Knee/Hip Exercises: Aerobic   Nustep L5 x 5 min UE/LE      Knee/Hip Exercises: Machines for Strengthening   Cybex Knee Extension 5# single leg left    Cybex Knee Flexion 15# single leg left x 10    Cybex Leg Press 55# x 20, sinle leg 35#      Knee/Hip Exercises: Standing   Heel Raises 20 reps    Heel Raises Limitations on airex,    Hip Flexion Limitations on airex x 20    Forward Lunges Right;2 sets;10 reps;Left    Forward Lunges Limitations front partial lunge at counter 2x10    Lateral Step Up 20 reps;Step Height: 6";Hand Hold: 1;Right    Forward Step Up Right;20 reps;Step Height: 6"    Step Down 15 reps;Step Height: 4"    Step Down Limitations UE support  with focus on R quad eccentric control      Knee/Hip Exercises: Seated   Long Arc  Quad 10 reps   10 sec holds   Long [employer] Limitations green      Manual Therapy   Manual therapy comments cross friction massage at quad tendon    Joint Mobilization patell mobs                    PT Short Term Goals - 07/19/20 1337      PT SHORT TERM GOAL #1   Title ROM 0-120    Baseline 06/26/2020: 10-115 degrees R knee AROM. 07/12/2020: 5-115    Time 3    Period Weeks    Status Partially Met    Target Date 06/25/19             PT Long Term Goals - 07/19/20 1339      PT LONG TERM GOAL #1   Title pt will be able to perform sit<>stand without use of UE    Baseline full reliance of UE at eval    Time 6    Period Weeks    Status Partially Met      PT LONG TERM GOAL #2   Title pt will be able to ambulate for at least 1 hour pain <=2/10 without AD    Baseline reports 8/10 today using RW    Time 6    Period Weeks    Status On-going      PT LONG TERM GOAL #3   Title pt will be able to return to gym and able to demontrate good form with use of equipment    Baseline not going to gym at  eval but would like to return    Time 6    Period Weeks    Status On-going      PT LONG TERM GOAL #4   Title pt will report ability to have full control of pressure through Rt foot for safety with walking and driving    Baseline poor control at eval    Time 6    Period Weeks    Status Partially Met                 Plan - 07/26/20 1157    Clinical Impression Statement Pt reports 2/10 pain at proximal knee. Continued with strengthening. Pt continues with decreased eccentric control. He requires significant UE assist to perfrom step down with retro step up on 4 inch step. Performed patella mobs and cross friction massage at quad tendon. His AROM has improved from 116 degrees last visit to 121 at end of session this visit.    PT Next Visit Plan progress as tolerated with knee strengthening/closed chain activities, balance/proprioceptive challenges, knee ROM as tolerated, heel slides    PT Home Exercise Plan [id number], gait with SPC- heel/toe           Patient will benefit from skilled therapeutic intervention in order to improve the following deficits and impairments:  Abnormal gait,Decreased balance,Difficulty walking,Increased muscle spasms,Impaired sensation,Improper body mechanics,Decreased range of motion,Decreased activity tolerance,Decreased strength,Pain,Impaired flexibility  Visit Diagnosis: Stiffness of right knee, not elsewhere classified  Acute pain of right knee  Difficulty in walking, not elsewhere classified  Other abnormalities of gait and mobility  Pain in right leg     Problem List Patient Active Problem List   Diagnosis Date Noted  . S/P right unicompartmental knee replacement 05/09/2020  . Primary localized osteoarthritis of left knee 02/10/2018  . S/P left unicompartmental knee replacement 02/10/2018    Dorene Ar, PTA  07/26/2020, 12:13 PM  [place],  [place], [address] Phone: [phone]   Fax:  [phone]  Name: Derek Lara MRN: [id number] Date of Birth: [birth year]

## 2020-07-28 ENCOUNTER — Other Ambulatory Visit: Payer: Self-pay

## 2020-07-28 ENCOUNTER — Encounter: Payer: Self-pay | Admitting: Physical Therapy

## 2020-07-28 ENCOUNTER — Ambulatory Visit: Payer: Medicare Other | Admitting: Physical Therapy

## 2020-07-28 DIAGNOSIS — R262 Difficulty in walking, not elsewhere classified: Secondary | ICD-10-CM

## 2020-07-28 DIAGNOSIS — M79604 Pain in right leg: Secondary | ICD-10-CM

## 2020-07-28 DIAGNOSIS — M25661 Stiffness of right knee, not elsewhere classified: Secondary | ICD-10-CM

## 2020-07-28 DIAGNOSIS — R2689 Other abnormalities of gait and mobility: Secondary | ICD-10-CM

## 2020-07-28 DIAGNOSIS — M25561 Pain in right knee: Secondary | ICD-10-CM

## 2020-07-28 NOTE — Therapy (Signed)
[place], [place], [address] Phone: [phone]   Fax:  [phone]  Physical Therapy Treatment  Patient Details  Name: Derek Lara MRN: [id number] Date of Birth: February 18, 1951 Referring Provider (PT): Marchia Bond, MD    Encounter Date: 07/28/2020   PT End of Session - 07/28/20 1105    Visit Number 13    Number of Visits 16    Date for PT Re-Evaluation 08/09/20    Authorization Type [employer] MCR, next progress note at visit 18    Authorization Time Period FOTO at  10th    PT Start Time 1100    PT Stop Time 1145    PT Time Calculation (min) 45 min           Past Medical History:  Diagnosis Date  . Diabetes mellitus without complication ([place])    TYPE 2   . Hypertension   . Primary localized osteoarthritis of left knee 02/10/2018    Past Surgical History:  Procedure Laterality Date  . CATARACT EXTRACTION W/ INTRAOCULAR LENS  IMPLANT, BILATERAL    . LEG SURGERY     vascular  . PARTIAL KNEE ARTHROPLASTY Left 02/10/2018   Procedure: LEFT UNICOMPARTMENTAL KNEE;  Surgeon: Marchia Bond, MD;  Location: [facility];  Service: Orthopedics;  Laterality: Left;  . PARTIAL KNEE ARTHROPLASTY Right 05/09/2020   Procedure: UNICOMPARTMENTAL KNEE;  Surgeon: Marchia Bond, MD;  Location: [facility];  Service: Orthopedics;  Laterality: Right;    There were no vitals filed for this visit.   Subjective Assessment - 07/28/20 1104    Subjective Pt reports 2/10 pain located at proximal anterior knee.    Currently in Pain? Yes    Pain Score 2     Pain Location Knee    Pain Orientation Right    Pain Descriptors / Indicators Aching    Pain Type Chronic pain                             [facility] Adult PT Treatment/Exercise - 07/28/20 0001      Knee/Hip Exercises: Stretches   Active Hamstring Stretch Limitations seated EOM x 3      Knee/Hip Exercises: Aerobic   Recumbent Bike L 2 x 5 minutes       Knee/Hip Exercises: Machines for Strengthening   Cybex Knee Extension 5# single leg left, then with bilat to single lower - cues for slow eccentric 10 x 4    Cybex Knee Flexion 15# single leg left x 10, then with bilat to single eccentric    Cybex Leg Press 55# x 20, sinle leg 35#      Knee/Hip Exercises: Standing   Heel Raises 20 reps    Forward Lunges Right;2 sets;10 reps;Left    Forward Lunges Limitations front partial lunge at counter 2x10    Hip Abduction Stengthening;Both;15 reps;Knee straight    Stairs up and down 6 inch steps , needs 1UE support and demonstrates decreased control on descent , completed 4 inch step downs with retro step up 10 x 2 with1 1 UE support , 6 inch laterl step up 10 x 2 , 1 UE    SLS SLS 3 sec Left      Knee/Hip Exercises: Seated   Sit to Sand 20 reps      Knee/Hip Exercises: Supine   Straight Leg Raises Limitations 2x10   cues for initial quad set     Manual  Therapy   Manual therapy comments cross friction massage at quad tendon    Joint Mobilization patell mobs                    PT Short Term Goals - 07/19/20 1337      PT SHORT TERM GOAL #1   Title ROM 0-120    Baseline 06/26/2020: 10-115 degrees R knee AROM. 07/12/2020: 5-115    Time 3    Period Weeks    Status Partially Met    Target Date 06/25/19             PT Long Term Goals - 07/19/20 1339      PT LONG TERM GOAL #1   Title pt will be able to perform sit<>stand without use of UE    Baseline full reliance of UE at eval    Time 6    Period Weeks    Status Partially Met      PT LONG TERM GOAL #2   Title pt will be able to ambulate for at least 1 hour pain <=2/10 without AD    Baseline reports 8/10 today using RW    Time 6    Period Weeks    Status On-going      PT LONG TERM GOAL #3   Title pt will be able to return to gym and able to demontrate good form with use of equipment    Baseline not going to gym at eval but would like to return    Time 6    Period Weeks     Status On-going      PT LONG TERM GOAL #4   Title pt will report ability to have full control of pressure through Rt foot for safety with walking and driving    Baseline poor control at eval    Time 6    Period Weeks    Status Partially Met                 Plan - 07/28/20 1200    Clinical Impression Statement Pt reports no change. His SLS is 2 seconds best. Encouraged him to practice SLS at sink when home. Continued with stair exercise for functional strengthening. Focused eccentric control with descending stairs and with gym machines. He had right knee pain with descending stairs with 1UE He requires cues to keep knee extended and prevent quad lag with SLR.    PT Next Visit Plan progress as tolerated with knee strengthening/closed chain activities, balance/proprioceptive challenges, knee ROM as tolerated, heel slides    PT Home Exercise Plan [id number], gait with SPC- heel/toe    Consulted and Agree with Plan of Care Patient           Patient will benefit from skilled therapeutic intervention in order to improve the following deficits and impairments:  Abnormal gait,Decreased balance,Difficulty walking,Increased muscle spasms,Impaired sensation,Improper body mechanics,Decreased range of motion,Decreased activity tolerance,Decreased strength,Pain,Impaired flexibility  Visit Diagnosis: Stiffness of right knee, not elsewhere classified  Acute pain of right knee  Difficulty in walking, not elsewhere classified  Other abnormalities of gait and mobility  Pain in right leg     Problem List Patient Active Problem List   Diagnosis Date Noted  . S/P right unicompartmental knee replacement 05/09/2020  . Primary localized osteoarthritis of left knee 02/10/2018  . S/P left unicompartmental knee replacement 02/10/2018    Dorene Ar, PTA 07/28/2020, 12:05 PM  [facility] [phone]  [place], [place],  [address] Phone: [phone]   Fax:  [phone]  Name: Derek Lara MRN: [id number] Date of Birth: 02/21/1951

## 2020-07-31 ENCOUNTER — Other Ambulatory Visit: Payer: Self-pay

## 2020-07-31 ENCOUNTER — Encounter: Payer: Self-pay | Admitting: Physical Therapy

## 2020-07-31 ENCOUNTER — Ambulatory Visit: Payer: Medicare Other | Admitting: Physical Therapy

## 2020-07-31 DIAGNOSIS — R2689 Other abnormalities of gait and mobility: Secondary | ICD-10-CM

## 2020-07-31 DIAGNOSIS — M25561 Pain in right knee: Secondary | ICD-10-CM

## 2020-07-31 DIAGNOSIS — M79604 Pain in right leg: Secondary | ICD-10-CM

## 2020-07-31 DIAGNOSIS — M25661 Stiffness of right knee, not elsewhere classified: Secondary | ICD-10-CM | POA: Diagnosis not present

## 2020-07-31 DIAGNOSIS — R262 Difficulty in walking, not elsewhere classified: Secondary | ICD-10-CM

## 2020-07-31 NOTE — Therapy (Signed)
[place], [place], [address] Phone: [phone]   Fax:  [phone]  Physical Therapy Treatment  Patient Details  Name: Derek Lara MRN: [id number] Date of Birth: [birth year] Referring Provider (PT): Marchia Bond, MD    Encounter Date: 07/31/2020   PT End of Session - 07/31/20 1729    Visit Number 14    Number of Visits 16    Date for PT Re-Evaluation 08/09/20    Authorization Type U[employer] MCR, next progress note at visit 18    Authorization Time Period FOTO at  10th    Progress Note Due on Visit 10    PT Start Time 1615    PT Stop Time 1700    PT Time Calculation (min) 45 min    Activity Tolerance Patient tolerated treatment well    Behavior During Therapy [facility] for tasks assessed/performed           Past Medical History:  Diagnosis Date  . Diabetes mellitus without complication ([place])    TYPE 2   . Hypertension   . Primary localized osteoarthritis of left knee 02/10/2018    Past Surgical History:  Procedure Laterality Date  . CATARACT EXTRACTION W/ INTRAOCULAR LENS  IMPLANT, BILATERAL    . LEG SURGERY     vascular  . PARTIAL KNEE ARTHROPLASTY Left 02/10/2018   Procedure: LEFT UNICOMPARTMENTAL KNEE;  Surgeon: Marchia Bond, MD;  Location: [facility];  Service: Orthopedics;  Laterality: Left;  . PARTIAL KNEE ARTHROPLASTY Right 05/09/2020   Procedure: UNICOMPARTMENTAL KNEE;  Surgeon: Marchia Bond, MD;  Location: [facility];  Service: Orthopedics;  Laterality: Right;    There were no vitals filed for this visit.   Subjective Assessment - 07/31/20 1618    Subjective Pt points to above patella anterior knee pain, nothing is new since last visit.    Currently in Pain? Yes    Pain Score 2     Pain Location Knee    Pain Orientation Right    Pain Descriptors / Indicators Aching    Pain Type Chronic pain                             [facility] Adult PT Treatment/Exercise - 07/31/20  0001      Knee/Hip Exercises: Aerobic   Nustep L5 x 5 mins LE      Knee/Hip Exercises: Machines for Strengthening   Cybex Knee Extension x20 bilateral, focus on slow eccentric lower 25#    Cybex Leg Press 55# x 20, single leg 35#      Knee/Hip Exercises: Standing   Heel Raises 20 reps;2 sets    Heel Raises Limitations --   tried doing SL, barely any excursion   Forward Lunges Both;2 sets;10 reps    Forward Lunges Limitations front partial lunge at counter x10    Hip Abduction Stengthening;Knee straight;20 reps;Both   3# ankle weight   Forward Step Up 20 reps;Step Height: 6";Both    Step Down Step Height: 4";10 reps;2 sets    Step Down Limitations lateral; UE support with focus on R quad eccentric control    Functional Squat 2 sets;10 reps    Other Standing Knee Exercises backwards lung x10 each side at counter    Other Standing Knee Exercises lateral lunge x10 each way                  PT Education - 07/31/20 1729  Education Details HEP, education on correlation between pain and strength    Methods Explanation;Demonstration;Tactile cues;Verbal cues    Comprehension Verbalized understanding;Need further instruction            PT Short Term Goals - 07/19/20 1337      PT SHORT TERM GOAL #1   Title ROM 0-120    Baseline 06/26/2020: 10-115 degrees R knee AROM. 07/12/2020: 5-115    Time 3    Period Weeks    Status Partially Met    Target Date 06/25/19             PT Long Term Goals - 07/19/20 1339      PT LONG TERM GOAL #1   Title pt will be able to perform sit<>stand without use of UE    Baseline full reliance of UE at eval    Time 6    Period Weeks    Status Partially Met      PT LONG TERM GOAL #2   Title pt will be able to ambulate for at least 1 hour pain <=2/10 without AD    Baseline reports 8/10 today using RW    Time 6    Period Weeks    Status On-going      PT LONG TERM GOAL #3   Title pt will be able to return to gym and able to demontrate  good form with use of equipment    Baseline not going to gym at eval but would like to return    Time 6    Period Weeks    Status On-going      PT LONG TERM GOAL #4   Title pt will report ability to have full control of pressure through Rt foot for safety with walking and driving    Baseline poor control at eval    Time 6    Period Weeks    Status Partially Met                 Plan - 07/31/20 1732    Clinical Impression Statement Pt tolerated tx well with no adverse effects. Pt progressed on standing CKC strengthening exercises, but needed multimodal cueing and demonstration to maintain proper form with decreased retention, especially with functional squats and lunges. Pt continues to show decreased eccentric quad control with lateral retro step downs. Continues to benefit from skilled PT in order to increase LE functional strength, increase balance, and decrease pain in order to help return to PLOF.    PT Treatment/Interventions ADLs/Self Care Home Management;Cryotherapy;Moist Heat;Electrical Stimulation;Gait training;Stair training;Functional mobility training;Neuromuscular re-education;Balance training;Therapeutic exercise;Therapeutic activities;Patient/family education;Manual techniques;Scar mobilization;Passive range of motion;Taping    PT Next Visit Plan PRN progression CKC strengthening activities, balance activities, and knee ROM    PT Home Exercise Plan [id number], gait with SPC- heel/toe    Consulted and Agree with Plan of Care Patient           Patient will benefit from skilled therapeutic intervention in order to improve the following deficits and impairments:  Abnormal gait,Decreased balance,Difficulty walking,Increased muscle spasms,Impaired sensation,Improper body mechanics,Decreased range of motion,Decreased activity tolerance,Decreased strength,Pain,Impaired flexibility  Visit Diagnosis: Stiffness of right knee, not elsewhere classified  Acute pain of right  knee  Difficulty in walking, not elsewhere classified  Other abnormalities of gait and mobility  Pain in right leg     Problem List Patient Active Problem List   Diagnosis Date Noted  . S/P right unicompartmental knee replacement 05/09/2020  . Primary localized   osteoarthritis of left knee 02/10/2018  . S/P left unicompartmental knee replacement 02/10/2018    Caleb Popp 07/31/2020, 5:38 PM  [facility] [address] [place], [place], [address] Phone: [phone]   Fax:  [phone]  Name: Derek Lara MRN: [id number] Date of Birth: 1951-02-18

## 2020-08-02 ENCOUNTER — Ambulatory Visit: Payer: Medicare Other | Admitting: Physical Therapy

## 2020-08-02 ENCOUNTER — Encounter: Payer: Self-pay | Admitting: Physical Therapy

## 2020-08-02 ENCOUNTER — Other Ambulatory Visit: Payer: Self-pay

## 2020-08-02 DIAGNOSIS — M25661 Stiffness of right knee, not elsewhere classified: Secondary | ICD-10-CM | POA: Diagnosis not present

## 2020-08-02 DIAGNOSIS — M79604 Pain in right leg: Secondary | ICD-10-CM

## 2020-08-02 DIAGNOSIS — R262 Difficulty in walking, not elsewhere classified: Secondary | ICD-10-CM

## 2020-08-02 DIAGNOSIS — R2689 Other abnormalities of gait and mobility: Secondary | ICD-10-CM

## 2020-08-02 DIAGNOSIS — M25561 Pain in right knee: Secondary | ICD-10-CM

## 2020-08-03 NOTE — Therapy (Signed)
[place], [place], [address] Phone: [phone]   Fax:  [phone]  Physical Therapy Treatment  Patient Details  Name: Derek Lara MRN: [id number] Date of Birth: [birth year] Referring Provider (PT): Marchia Bond, MD    Encounter Date: 08/02/2020   PT End of Session - 08/02/20 1610    Visit Number 15    Number of Visits 16    Date for PT Re-Evaluation 08/09/20    Authorization Type UHC MCR, next progress note at visit 18    Authorization Time Period FOTO at  10th    PT Start Time 1545    PT Stop Time 1624    PT Time Calculation (min) 39 min    Activity Tolerance Patient tolerated treatment well    Behavior During Therapy [facility] for tasks assessed/performed           Past Medical History:  Diagnosis Date  . Diabetes mellitus without complication ([place])    TYPE 2   . Hypertension   . Primary localized osteoarthritis of left knee 02/10/2018    Past Surgical History:  Procedure Laterality Date  . CATARACT EXTRACTION W/ INTRAOCULAR LENS  IMPLANT, BILATERAL    . LEG SURGERY     vascular  . PARTIAL KNEE ARTHROPLASTY Left 02/10/2018   Procedure: LEFT UNICOMPARTMENTAL KNEE;  Surgeon: Marchia Bond, MD;  Location: [facility];  Service: Orthopedics;  Laterality: Left;  . PARTIAL KNEE ARTHROPLASTY Right 05/09/2020   Procedure: UNICOMPARTMENTAL KNEE;  Surgeon: Marchia Bond, MD;  Location: [facility];  Service: Orthopedics;  Laterality: Right;    There were no vitals filed for this visit.   Subjective Assessment - 08/02/20 1548    Subjective Patient is making progress. he reports it is hurting a  bit but it is improving. he is no longer having pain on the insdie of his calf. He has been working on his exercises. He went to the MD who wants him to finish his bvisits then D/C to HEP    How long can you sit comfortably? unlimited    How long can you stand comfortably? 30 min    How long can you walk  comfortably? 30 min    Diagnostic tests N/A    Patient Stated Goals walk without AD, run on treadmil, go back to gym. drive    Currently in Pain? Yes    Pain Score 2     Pain Location Knee    Pain Orientation Right    Pain Descriptors / Indicators Aching    Pain Type Chronic pain    Pain Onset More than a month ago    Pain Frequency Intermittent    Aggravating Factors  prolonged sitting    Pain Relieving Factors ice    Effect of Pain on Daily Activities pain with driving                             [facility] Adult PT Treatment/Exercise - 08/03/20 0001      Knee/Hip Exercises: Stretches   Active Hamstring Stretch 2 reps;30 seconds;Right      Knee/Hip Exercises: Aerobic   Nustep L5 x 5 mins LE      Knee/Hip Exercises: Standing   Heel Raises 20 reps;2 sets    Hip Abduction Stengthening;Knee straight;20 reps;Both   3# ankle weight   Forward Step Up 20 reps;Step Height: 6";Both    Step Down Step Height: 4";10 reps;2 sets  Step Down Limitations lateral; UE support with focus on R quad eccentric control    Functional Squat 2 sets;10 reps      Knee/Hip Exercises: Seated   Long Arc Quad Limitations 3x10 3lb      Manual Therapy   Manual Therapy Joint mobilization    Soft tissue mobilization to postierior knee and IT band to improve extension; to medial calf    Passive ROM right knee extension                  PT Education - 08/02/20 1609    Education Details teachnique with HEP    Person(s) Educated Patient    Methods Explanation;Demonstration    Comprehension Verbalized understanding;Returned demonstration            PT Short Term Goals - 07/19/20 1337      PT SHORT TERM GOAL #1   Title ROM 0-120    Baseline 06/26/2020: 10-115 degrees R knee AROM. 07/12/2020: 5-115    Time 3    Period Weeks    Status Partially Met    Target Date 06/25/19             PT Long Term Goals - 07/19/20 1339      PT LONG TERM GOAL #1   Title pt will be able  to perform sit<>stand without use of UE    Baseline full reliance of UE at eval    Time 6    Period Weeks    Status Partially Met      PT LONG TERM GOAL #2   Title pt will be able to ambulate for at least 1 hour pain <=2/10 without AD    Baseline reports 8/10 today using RW    Time 6    Period Weeks    Status On-going      PT LONG TERM GOAL #3   Title pt will be able to return to gym and able to demontrate good form with use of equipment    Baseline not going to gym at eval but would like to return    Time 6    Period Weeks    Status On-going      PT LONG TERM GOAL #4   Title pt will report ability to have full control of pressure through Rt foot for safety with walking and driving    Baseline poor control at eval    Time 6    Period Weeks    Status Partially Met                 Plan - 08/02/20 1610    Clinical Impression Statement Patient continues to make great progress. He had no increase in pain with ther-ex. We were able to add weights without increased pain. He tolerated steps well. We will continue to progress him towards a home program.    Personal Factors and Comorbidities Comorbidity 1    Comorbidities DM    Examination-Activity Limitations Squat;Stairs;Bend;Stand;Locomotion Level;Transfers    Examination-Participation Restrictions Driving    Stability/Clinical Decision Making Stable/Uncomplicated    Clinical Decision Making Low    Rehab Potential Good    PT Frequency 2x / week    PT Duration 4 weeks    PT Treatment/Interventions ADLs/Self Care Home Management;Cryotherapy;Moist Heat;Electrical Stimulation;Gait training;Stair training;Functional mobility training;Neuromuscular re-education;Balance training;Therapeutic exercise;Therapeutic activities;Patient/family education;Manual techniques;Scar mobilization;Passive range of motion;Taping    PT Next Visit Plan PRN progression CKC strengthening activities, balance activities, and knee ROM  PT Home Exercise  Plan [id number], gait with SPC- heel/toe    Consulted and Agree with Plan of Care Patient           Patient will benefit from skilled therapeutic intervention in order to improve the following deficits and impairments:  Abnormal gait,Decreased balance,Difficulty walking,Increased muscle spasms,Impaired sensation,Improper body mechanics,Decreased range of motion,Decreased activity tolerance,Decreased strength,Pain,Impaired flexibility  Visit Diagnosis: Stiffness of right knee, not elsewhere classified  Acute pain of right knee  Difficulty in walking, not elsewhere classified  Other abnormalities of gait and mobility  Pain in right leg     Problem List Patient Active Problem List   Diagnosis Date Noted  . S/P right unicompartmental knee replacement 05/09/2020  . Primary localized osteoarthritis of left knee 02/10/2018  . S/P left unicompartmental knee replacement 02/10/2018     Carney Living  PT DPT  08/03/2020, 8:34 AM  [facility] [address] [place], [place], [address] Phone: [phone]   Fax:  [phone]  Name: Derek Lara MRN: [id number] Date of Birth: 1950/09/03

## 2020-08-07 ENCOUNTER — Ambulatory Visit: Payer: Medicare Other | Admitting: Physical Therapy

## 2020-08-09 ENCOUNTER — Encounter: Payer: Self-pay | Admitting: Physical Therapy

## 2020-08-09 ENCOUNTER — Other Ambulatory Visit: Payer: Self-pay

## 2020-08-09 ENCOUNTER — Ambulatory Visit: Payer: Medicare Other | Admitting: Physical Therapy

## 2020-08-09 DIAGNOSIS — M25661 Stiffness of right knee, not elsewhere classified: Secondary | ICD-10-CM | POA: Diagnosis not present

## 2020-08-09 DIAGNOSIS — R2689 Other abnormalities of gait and mobility: Secondary | ICD-10-CM

## 2020-08-09 DIAGNOSIS — M25561 Pain in right knee: Secondary | ICD-10-CM

## 2020-08-09 DIAGNOSIS — R262 Difficulty in walking, not elsewhere classified: Secondary | ICD-10-CM

## 2020-08-10 ENCOUNTER — Encounter: Payer: Self-pay | Admitting: Physical Therapy

## 2020-08-10 NOTE — Therapy (Signed)
[place], [place], [address] Phone: [phone]   Fax:  [phone]  Physical Therapy Treatment  Patient Details  Name: Derek Lara MRN: [id number] Date of Birth: [birth year] Referring Provider (PT): Marchia Bond, MD    Encounter Date: 08/09/2020   PT End of Session - 08/09/20 1550    Visit Number 16    Number of Visits 19    Date for PT Re-Evaluation 08/30/20    Authorization Type [employer] MCR, next progress note at visit 18    Authorization Time Period FOTO at  10th    PT Start Time 1545    PT Stop Time 1628    PT Time Calculation (min) 43 min    Activity Tolerance Patient tolerated treatment well    Behavior During Therapy [facility] for tasks assessed/performed           Past Medical History:  Diagnosis Date  . Diabetes mellitus without complication ([place])    TYPE 2   . Hypertension   . Primary localized osteoarthritis of left knee 02/10/2018    Past Surgical History:  Procedure Laterality Date  . CATARACT EXTRACTION W/ INTRAOCULAR LENS  IMPLANT, BILATERAL    . LEG SURGERY     vascular  . PARTIAL KNEE ARTHROPLASTY Left 02/10/2018   Procedure: LEFT UNICOMPARTMENTAL KNEE;  Surgeon: Marchia Bond, MD;  Location: [place];  Service: Orthopedics;  Laterality: Left;  . PARTIAL KNEE ARTHROPLASTY Right 05/09/2020   Procedure: UNICOMPARTMENTAL KNEE;  Surgeon: Marchia Bond, MD;  Location: [facility];  Service: Orthopedics;  Laterality: Right;    There were no vitals filed for this visit.   Subjective Assessment - 08/09/20 1547    Subjective Patient did a lot of standing yesterday> his knee got a little sore. It is still a little sore today.    How long can you sit comfortably? unlimited    How long can you stand comfortably? 30 min    How long can you walk comfortably? 30 min    Diagnostic tests N/A    Patient Stated Goals walk without AD, run on treadmil, go back to gym. drive    Currently in Pain?  Yes    Pain Score 2     Pain Location Knee    Pain Orientation Right    Pain Descriptors / Indicators Aching    Pain Type Surgical pain    Pain Onset More than a month ago    Pain Frequency Intermittent    Aggravating Factors  standing for long periods of time    Pain Relieving Factors rest    Effect of Pain on Daily Activities pain with prolonged standing                             [facility] Adult PT Treatment/Exercise - 08/10/20 0001      Knee/Hip Exercises: Aerobic   Nustep L5 x 5 mins LE      Knee/Hip Exercises: Machines for Strengthening   Cybex Knee Extension x20 bilateral, focus on slow eccentric lower 25#    Cybex Knee Flexion 15# single leg left x 10, then with bilat to single eccentric    Cybex Leg Press 55# x 20, single leg 35#      Knee/Hip Exercises: Standing   Heel Raises 20 reps;2 sets    Hip Flexion Limitations on airex x 20    Forward Lunges Both;2 sets;10 reps    Lateral  Step Up 20 reps;Step Height: 6";Hand Hold: 1;Right    Forward Step Up 20 reps;Step Height: 6";Both    Step Down Step Height: 4";10 reps;2 sets    Other Standing Knee Exercises step onto air-ex and lateral step onto air-ex x20      Knee/Hip Exercises: Supine   Short Arc Quad Sets Limitations 3x10    Straight Leg Raises Limitations 2x10      Manual Therapy   Manual Therapy Joint mobilization    Soft tissue mobilization to postierior knee and IT band to improve extension; to medial calf    Passive ROM right knee extension                  PT Education - 08/09/20 1548    Education Details reviewed HEP and symptom mangement    Person(s) Educated Patient    Methods Explanation;Demonstration;Tactile cues;Verbal cues    Comprehension Verbalized understanding;Verbal cues required;Tactile cues required;Returned demonstration            PT Short Term Goals - 07/19/20 1337      PT SHORT TERM GOAL #1   Title ROM 0-120    Baseline 06/26/2020: 10-115 degrees R knee  AROM. 07/12/2020: 5-115    Time 3    Period Weeks    Status Partially Met    Target Date 06/25/19             PT Long Term Goals - 07/19/20 1339      PT LONG TERM GOAL #1   Title pt will be able to perform sit<>stand without use of UE    Baseline full reliance of UE at eval    Time 6    Period Weeks    Status Partially Met      PT LONG TERM GOAL #2   Title pt will be able to ambulate for at least 1 hour pain <=2/10 without AD    Baseline reports 8/10 today using RW    Time 6    Period Weeks    Status On-going      PT LONG TERM GOAL #3   Title pt will be able to return to gym and able to demontrate good form with use of equipment    Baseline not going to gym at eval but would like to return    Time 6    Period Weeks    Status On-going      PT LONG TERM GOAL #4   Title pt will report ability to have full control of pressure through Rt foot for safety with walking and driving    Baseline poor control at eval    Time 6    Period Weeks    Status Partially Met                 Plan - 08/10/20 1508    Clinical Impression Statement Depsite mild baseline pain the patient did well today,. He continues to work on steps and standing activity.His range is proegressing well. he has 2 more visits scheduled. We will reviewe his HEP and continue to work on extension with expected discharge after those visits.    Personal Factors and Comorbidities Comorbidity 1    Comorbidities DM    Examination-Activity Limitations Squat;Stairs;Bend;Stand;Locomotion Level;Transfers    Stability/Clinical Decision Making Stable/Uncomplicated    Clinical Decision Making Low    Rehab Potential Good    PT Frequency 2x / week    PT Duration 4 weeks    PT  Treatment/Interventions ADLs/Self Care Home Management;Cryotherapy;Moist Heat;Electrical Stimulation;Gait training;Stair training;Functional mobility training;Neuromuscular re-education;Balance training;Therapeutic exercise;Therapeutic  activities;Patient/family education;Manual techniques;Scar mobilization;Passive range of motion;Taping    PT Next Visit Plan PRN progression CKC strengthening activities, balance activities, and knee ROM    PT Home Exercise Plan [id number], gait with SPC- heel/toe    Consulted and Agree with Plan of Care Patient           Patient will benefit from skilled therapeutic intervention in order to improve the following deficits and impairments:  Abnormal gait,Decreased balance,Difficulty walking,Increased muscle spasms,Impaired sensation,Improper body mechanics,Decreased range of motion,Decreased activity tolerance,Decreased strength,Pain,Impaired flexibility  Visit Diagnosis: Stiffness of right knee, not elsewhere classified  Difficulty in walking, not elsewhere classified  Acute pain of right knee  Other abnormalities of gait and mobility     Problem List Patient Active Problem List   Diagnosis Date Noted  . S/P right unicompartmental knee replacement 05/09/2020  . Primary localized osteoarthritis of left knee 02/10/2018  . S/P left unicompartmental knee replacement 02/10/2018    Carney Living PT DPT 08/10/2020, 3:32 PM  [facility] [address] [place], [place], [address] Phone: [phone]   Fax:  [phone]  Name: Tonnie Friedel MRN: [id number] Date of Birth: January 24, 1951

## 2020-08-15 ENCOUNTER — Other Ambulatory Visit: Payer: Self-pay

## 2020-08-15 ENCOUNTER — Encounter: Payer: Self-pay | Admitting: Physical Therapy

## 2020-08-15 ENCOUNTER — Ambulatory Visit: Payer: Medicare Other | Attending: Orthopedic Surgery | Admitting: Physical Therapy

## 2020-08-15 DIAGNOSIS — R2689 Other abnormalities of gait and mobility: Secondary | ICD-10-CM | POA: Insufficient documentation

## 2020-08-15 DIAGNOSIS — M25661 Stiffness of right knee, not elsewhere classified: Secondary | ICD-10-CM | POA: Insufficient documentation

## 2020-08-15 DIAGNOSIS — R262 Difficulty in walking, not elsewhere classified: Secondary | ICD-10-CM | POA: Diagnosis present

## 2020-08-15 DIAGNOSIS — M25561 Pain in right knee: Secondary | ICD-10-CM | POA: Insufficient documentation

## 2020-08-16 NOTE — Therapy (Signed)
[place], [place], [address] Phone: [phone]   Fax:  [phone]  Physical Therapy Treatment  Patient Details  Name: Derek Lara MRN: [id number] Date of Birth: [birth year] Referring Provider (PT): Marchia Bond, MD    Encounter Date: 08/15/2020    PT End of Session - 08/15/20 1559    Visit Number 17    Number of Visits 19    Date for PT Re-Evaluation 08/30/20    Authorization Type UHC MCR, next progress note at visit 18    PT Start Time 1545    PT Stop Time 1623    PT Time Calculation (min) 38 min           Past Medical History:  Diagnosis Date  . Diabetes mellitus without complication ([place])    TYPE 2   . Hypertension   . Primary localized osteoarthritis of left knee 02/10/2018    Past Surgical History:  Procedure Laterality Date  . CATARACT EXTRACTION W/ INTRAOCULAR LENS  IMPLANT, BILATERAL    . LEG SURGERY     vascular  . PARTIAL KNEE ARTHROPLASTY Left 02/10/2018   Procedure: LEFT UNICOMPARTMENTAL KNEE;  Surgeon: Marchia Bond, MD;  Location: [place];  Service: Orthopedics;  Laterality: Left;  . PARTIAL KNEE ARTHROPLASTY Right 05/09/2020   Procedure: UNICOMPARTMENTAL KNEE;  Surgeon: Marchia Bond, MD;  Location[facility];  Service: Orthopedics;  Laterality: Right;    There were no vitals filed for this visit.   Subjective Assessment - 08/15/20 1549    Subjective Patient walked 2.5 miles yesterday. He is now sore. He reports it isn;t too bad though at this time.    How long can you sit comfortably? unlimited    How long can you stand comfortably? 30 min    How long can you walk comfortably? 30 min    Diagnostic tests N/A    Patient Stated Goals walk without AD, run on treadmil, go back to gym. drive    Currently in Pain? Yes    Pain Score 2     Pain Location Knee    Pain Orientation Right    Pain Descriptors / Indicators Aching    Pain Type Surgical pain    Pain Onset More  than a month ago    Pain Frequency Intermittent    Aggravating Factors  standing for long periods of time    Pain Relieving Factors rest    Effect of Pain on Daily Activities pain with walking                             [facility] Adult PT Treatment/Exercise - 08/16/20 0001      Knee/Hip Exercises: Aerobic   Nustep L5 x 5 mins LE      Knee/Hip Exercises: Machines for Strengthening   Cybex Knee Extension x20 bilateral, focus on slow eccentric lower 25#    Cybex Knee Flexion 15# single leg left x 10, then with bilat to single eccentric    Cybex Leg Press 40#3x10 single leg      Knee/Hip Exercises: Standing   Heel Raises 20 reps;2 sets    Hip Flexion Limitations on airex x 20    Forward Lunges Both;2 sets;10 reps    Lateral Step Up 20 reps;Step Height: 6";Hand Hold: 1;Right    Forward Step Up 20 reps;Step Height: 6";Both    Step Down Step Height: 4";10 reps;2 sets;Step Height: 6";Step Height: 2"  Functional Squat Limitations x20    Other Standing Knee Exercises step onto air-ex and lateral step onto air-ex x20      Knee/Hip Exercises: Supine   Short Arc Quad Sets Limitations 3x10    Heel Slides Limitations 2lb    Straight Leg Raises Limitations 3x10 2lb      Manual Therapy   Manual Therapy Joint mobilization    Soft tissue mobilization to postierior knee and IT band to improve extension; to medial calf    Passive ROM right knee extension                  PT Education - 08/15/20 1558    Education Details improtance of extension stretching    Person(s) Educated Patient    Methods Tactile cues;Demonstration;Explanation    Comprehension Verbalized understanding;Returned demonstration;Verbal cues required;Tactile cues required            PT Short Term Goals - 07/19/20 1337      PT SHORT TERM GOAL #1   Title ROM 0-120    Baseline 06/26/2020: 10-115 degrees R knee AROM. 07/12/2020: 5-115    Time 3    Period Weeks    Status Partially Met    Target  Date 06/25/19             PT Long Term Goals - 07/19/20 1339      PT LONG TERM GOAL #1   Title pt will be able to perform sit<>stand without use of UE    Baseline full reliance of UE at eval    Time 6    Period Weeks    Status Partially Met      PT LONG TERM GOAL #2   Title pt will be able to ambulate for at least 1 hour pain <=2/10 without AD    Baseline reports 8/10 today using RW    Time 6    Period Weeks    Status On-going      PT LONG TERM GOAL #3   Title pt will be able to return to gym and able to demontrate good form with use of equipment    Baseline not going to gym at eval but would like to return    Time 6    Period Weeks    Status On-going      PT LONG TERM GOAL #4   Title pt will report ability to have full control of pressure through Rt foot for safety with walking and driving    Baseline poor control at eval    Time 6    Period Weeks    Status Partially Met                 Plan - 08/16/20 0827    Clinical Impression Statement Patient contineus to make great progress. We advanced his weights today. He is independnet with his home exercises. He feels like he still has some difficulty with recirpocal gait up and down the steps. We will practie this next time. Anticpate D/C to HEP next visit.    Personal Factors and Comorbidities Comorbidity 1    Comorbidities DM    Examination-Activity Limitations Squat;Stairs;Bend;Stand;Locomotion Level;Transfers    Stability/Clinical Decision Making Stable/Uncomplicated    Clinical Decision Making Low    Rehab Potential Good    PT Frequency 2x / week    PT Duration 4 weeks    PT Treatment/Interventions ADLs/Self Care Home Management;Cryotherapy;Moist Heat;Electrical Stimulation;Gait training;Stair training;Functional mobility training;Neuromuscular re-education;Balance training;Therapeutic exercise;Therapeutic activities;Patient/family education;Manual techniques;Scar  mobilization;Passive range of motion;Taping     PT Next Visit Plan PRN progression CKC strengthening activities, balance activities, and knee ROM    PT Home Exercise Plan [id number], gait with SPC- heel/toe    Consulted and Agree with Plan of Care Patient           Patient will benefit from skilled therapeutic intervention in order to improve the following deficits and impairments:  Abnormal gait,Decreased balance,Difficulty walking,Increased muscle spasms,Impaired sensation,Improper body mechanics,Decreased range of motion,Decreased activity tolerance,Decreased strength,Pain,Impaired flexibility  Visit Diagnosis: Stiffness of right knee, not elsewhere classified  Difficulty in walking, not elsewhere classified  Acute pain of right knee  Other abnormalities of gait and mobility     Problem List Patient Active Problem List   Diagnosis Date Noted  . S/P right unicompartmental knee replacement 05/09/2020  . Primary localized osteoarthritis of left knee 02/10/2018  . S/P left unicompartmental knee replacement 02/10/2018    Carney Living PT DPT  08/16/2020, 9:26 AM  [facility] [address] [place], [place], [address] Phone: [phone]   Fax:  [phone]  Name: Cavan Bearden MRN: [id number] Date of Birth: February 22, 1951

## 2020-08-17 ENCOUNTER — Encounter: Payer: Self-pay | Admitting: Physical Therapy

## 2020-08-17 ENCOUNTER — Ambulatory Visit: Payer: Medicare Other | Admitting: Physical Therapy

## 2020-08-17 ENCOUNTER — Other Ambulatory Visit: Payer: Self-pay

## 2020-08-17 DIAGNOSIS — M25661 Stiffness of right knee, not elsewhere classified: Secondary | ICD-10-CM

## 2020-08-17 DIAGNOSIS — M25561 Pain in right knee: Secondary | ICD-10-CM

## 2020-08-17 DIAGNOSIS — R262 Difficulty in walking, not elsewhere classified: Secondary | ICD-10-CM

## 2020-08-17 DIAGNOSIS — R2689 Other abnormalities of gait and mobility: Secondary | ICD-10-CM

## 2020-08-18 NOTE — Therapy (Signed)
[place], [place], [address] Phone: [phone]   Fax:  [phone]  Physical Therapy Treatment/ Discharge   Patient Details  Name: Derek Lara MRN: [id number] Date of Birth: August 04, 1950 Referring Provider (PT): Marchia Bond, MD    Encounter Date: 08/17/2020   PT End of Session - 08/17/20 1607    Visit Number 18    Number of Visits 19    Date for PT Re-Evaluation 08/30/20    Authorization Type UHC MCR, next progress note at visit 18    PT Start Time 1545    PT Stop Time 1623    PT Time Calculation (min) 38 min    Activity Tolerance Patient tolerated treatment well    Behavior During Therapy [facility] for tasks assessed/performed           Past Medical History:  Diagnosis Date  . 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 Hypertension   . Primary localized osteoarthritis of left knee 02/10/2018    Past Surgical History:  Procedure Laterality Date  . CATARACT EXTRACTION W/ INTRAOCULAR LENS  IMPLANT, BILATERAL    . LEG SURGERY     vascular  . PARTIAL KNEE ARTHROPLASTY Left 02/10/2018   Procedure: LEFT UNICOMPARTMENTAL KNEE;  Surgeon: Marchia Bond, MD;  Location: [place];  Service: Orthopedics;  Laterality: Left;  . PARTIAL KNEE ARTHROPLASTY Right 05/09/2020   Procedure: UNICOMPARTMENTAL KNEE;  Surgeon: Marchia Bond, MD;  Location[facility];  Service: Orthopedics;  Laterality: Right;    There were no vitals filed for this visit.   Subjective Assessment - 08/17/20 1549    Subjective Patient reports he has just a little pain when he is walking.   He feels he is ready for discharge to HEP.    How long can you sit comfortably? unlimited    How long can you stand comfortably? 30 min    How long can you walk comfortably? 30 min    Diagnostic tests N/A    Patient Stated Goals walk without AD, run on treadmil, go back to gym. drive    Currently in Pain? Yes    Pain Score 2      Pain Location Knee    Pain Orientation Right    Pain Descriptors / Indicators Aching    Pain Type Surgical pain    Pain Onset More than a month ago    Pain Frequency Constant    Aggravating Factors  standing for a long time    Pain Relieving Factors rest    Effect of Pain on Daily Activities pain with walking                             [facility] Adult PT Treatment/Exercise - 08/18/20 0001      Self-Care   Other Self-Care Comments  reviewed expected progression of activity; reviewed how to progress exercises at home.      Therapeutic Activites    Therapeutic Activities Other Therapeutic Activities    Other Therapeutic Activities reviewed proper tehcnique with steps. with full steps he is still having difficulty going down with a reciprocal gait patten. He was advised this will improve with time and safety is most improtant now. 4 laps up and down flight of stpes.      Knee/Hip Exercises: Aerobic   Nustep L5 x 5 mins LE      Knee/Hip Exercises: Standing  Heel Raises 20 reps;2 sets    Hip Flexion Limitations on airex x 20    Forward Lunges Both;2 sets;10 reps    Lateral Step Up 20 reps;Step Height: 6";Hand Hold: 1;Right    Forward Step Up 20 reps;Step Height: 6";Both    Step Down Step Height: 4";10 reps;2 sets;Step Height: 6";Step Height: 2"    Functional Squat Limitations x20    Other Standing Knee Exercises step onto air-ex and lateral step onto air-ex x20      Knee/Hip Exercises: Seated   Other Seated Knee/Hip Exercises patient given stread exercises for home      Knee/Hip Exercises: Supine   Heel Slides Limitations 2lb    Straight Leg Raises Limitations 3x10 2lb      Manual Therapy   Manual Therapy Joint mobilization    Soft tissue mobilization to postierior knee and IT band to improve extension; to medial calf    Passive ROM right knee extension                    PT Short Term Goals - 08/18/20 1039      PT SHORT TERM GOAL #1   Title ROM  0-120    Baseline full    Time 3    Period Weeks    Status Achieved    Target Date 06/25/19             PT Long Term Goals - 08/18/20 1039      PT LONG TERM GOAL #1   Title pt will be able to perform sit<>stand without use of UE    Baseline no use of hands    Time 6    Period Weeks    Status Achieved      PT LONG TERM GOAL #2   Title pt will be able to ambulate for at least 1 hour pain <=2/10 without AD    Baseline has ambualted 2.5 miles with mild pain    Time 6    Period Weeks    Status Partially Met      PT LONG TERM GOAL #3   Title pt will be able to return to gym and able to demontrate good form with use of equipment    Baseline good form    Time 6    Period Weeks    Status Achieved      PT LONG TERM GOAL #4   Title pt will report ability to have full control of pressure through Rt foot for safety with walking and driving    Baseline poor control at eval    Time 6    Period Weeks    Status Achieved                 Plan - 08/17/20 1608    Clinical Impression Statement Patient has made great progress. he still has minor pain walking but he is walking 2.5 miles. He is also still having difficulty going down the stairs with step-over pattenr. He is still doing step to. He was advised to continue this for now as it dosent appear he has quite enoiugh control yet. he has a full exercise program. h eisworking hard on his exercises at home. D/C to HEP. See below for goal specific progress.    Personal Factors and Comorbidities Comorbidity 1    Comorbidities DM    Examination-Activity Limitations Squat;Stairs;Bend;Stand;Locomotion Level;Transfers    Examination-Participation Restrictions Driving    Stability/Clinical Decision Making Stable/Uncomplicated  Clinical Decision Making Low    Rehab Potential Good    PT Frequency 2x / week    PT Duration 4 weeks    PT Treatment/Interventions ADLs/Self Care Home Management;Cryotherapy;Moist Heat;Electrical  Stimulation;Gait training;Stair training;Functional mobility training;Neuromuscular re-education;Balance training;Therapeutic exercise;Therapeutic activities;Patient/family education;Manual techniques;Scar mobilization;Passive range of motion;Taping    PT Next Visit Plan PRN progression CKC strengthening activities, balance activities, and knee ROM    PT Home Exercise Plan [id number], gait with SPC- heel/toe    Consulted and Agree with Plan of Care Patient           PHYSICAL THERAPY DISCHARGE SUMMARY  Visits from Start of Care: 18  Current functional level related to goals / functional outcomes: singiifcant improvement in ability to walk and perfrom adl's    Remaining deficits: Mild difficulty going down steps, mild pain with ambualtion    Education / Equipment: HEP Plan: Patient agrees to discharge.  Patient goals were met. Patient is being discharged due to meeting the stated rehab goals.  ?????       Patient will benefit from skilled therapeutic intervention in order to improve the following deficits and impairments:  Abnormal gait,Decreased balance,Difficulty walking,Increased muscle spasms,Impaired sensation,Improper body mechanics,Decreased range of motion,Decreased activity tolerance,Decreased strength,Pain,Impaired flexibility  Visit Diagnosis: Stiffness of right knee, not elsewhere classified  Difficulty in walking, not elsewhere classified  Acute pain of right knee  Other abnormalities of gait and mobility     Problem List Patient Active Problem List   Diagnosis Date Noted  . S/P right unicompartmental knee replacement 05/09/2020  . Primary localized osteoarthritis of left knee 02/10/2018  . S/P left unicompartmental knee replacement 02/10/2018    Carney Living 08/18/2020, 10:54 AM  [facility] [address] [place], [place], [address] Phone: [phone]   Fax:  [phone]  Name: Iverson Sees MRN: [id number] Date of Birth: September 17, 1950

## 2022-09-23 IMAGING — DX DG KNEE 1-2V PORT*R*
2 series · 2 of 2 positions shown · non-contrast
Comparison: None

CLINICAL DATA: 69-year-old male with a history knee replacement

EXAM:
PORTABLE RIGHT KNEE - 1-2 VIEW

[knee ap]
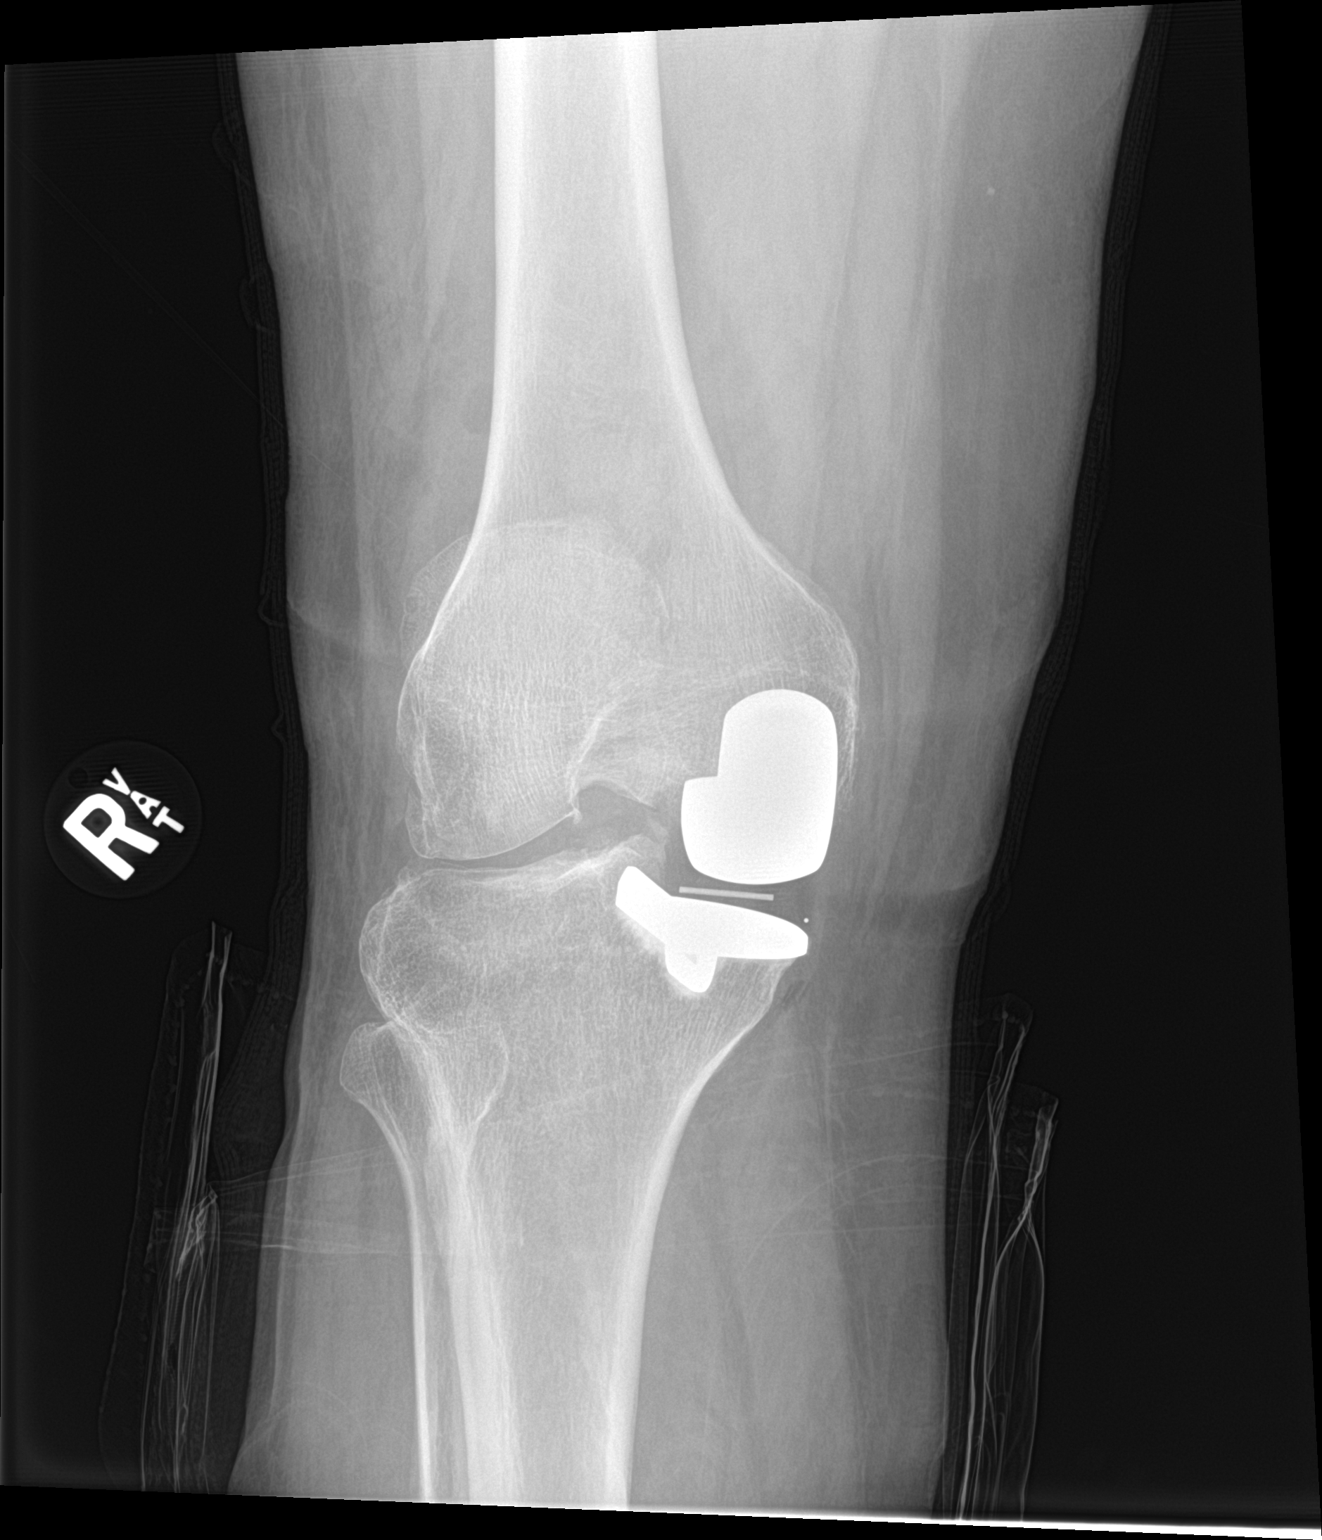

[knee lat]
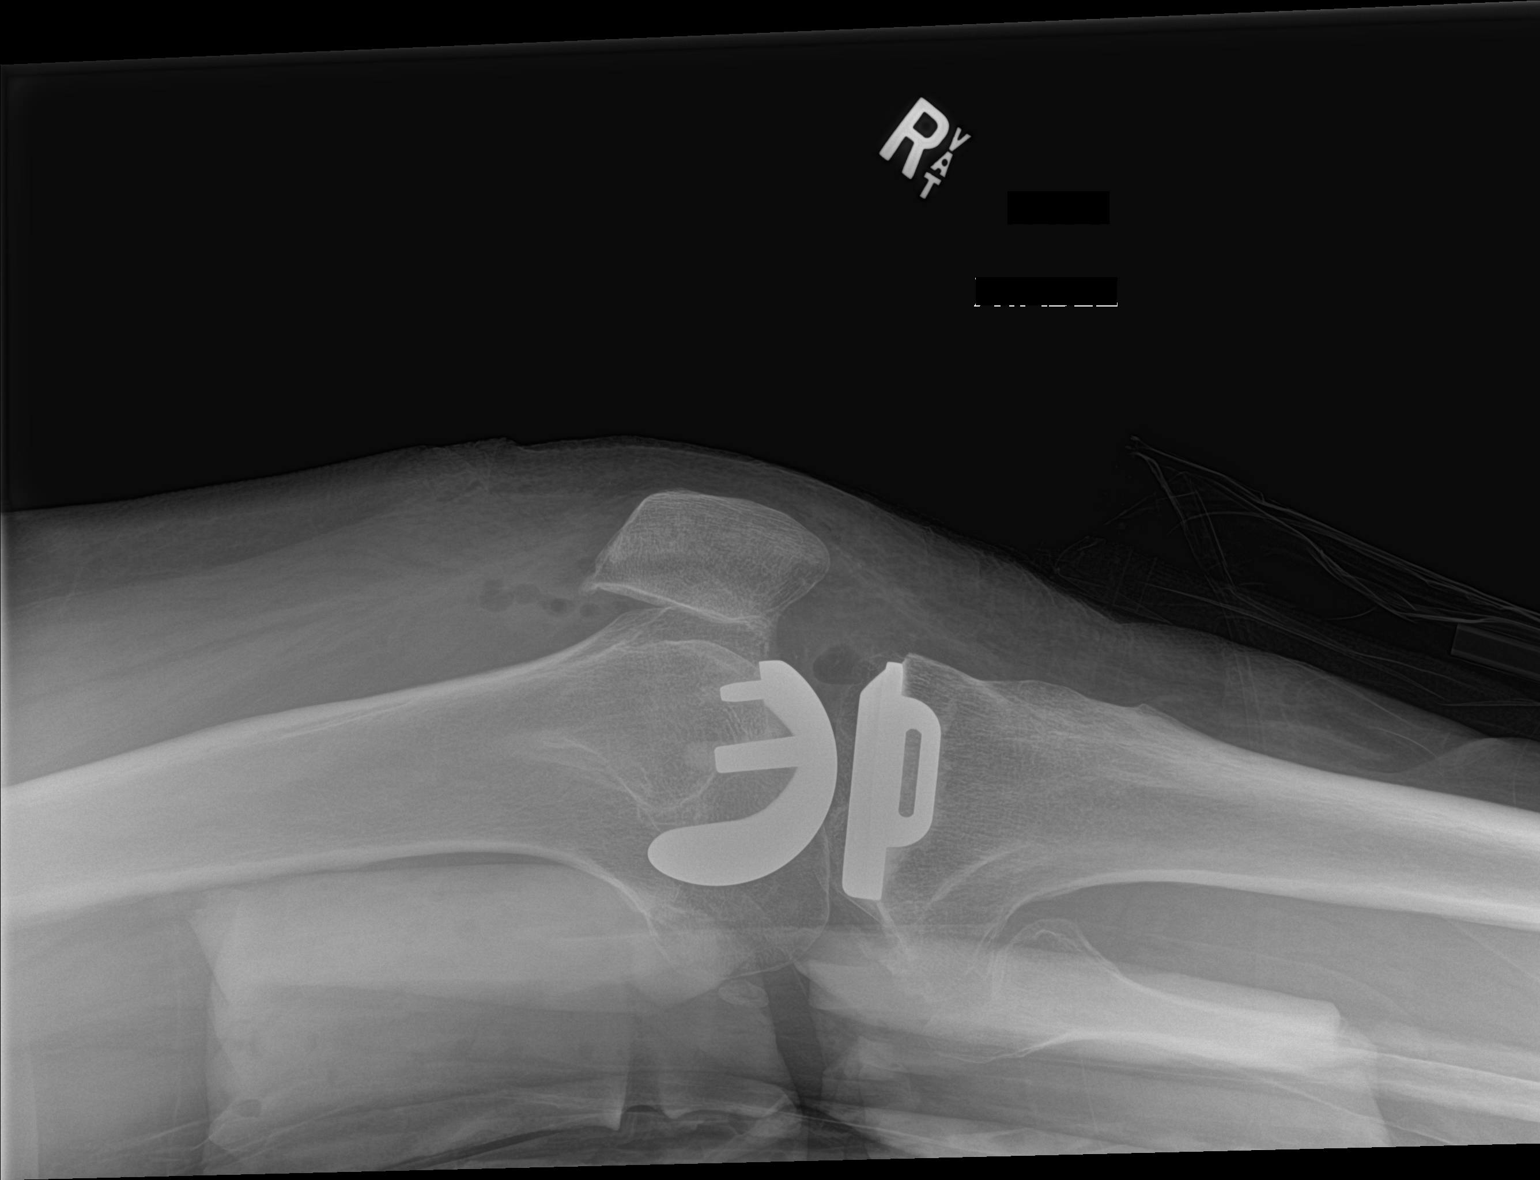

[2 of 2 positions shown; findings below may reference images not displayed]

FINDINGS: Surgical changes of unicompartmental arthroplasty of the medial
compartment right knee. Gas in soft tissue swelling at the surgical
bed. No fracture. Marginal osteophyte formation at the lateral
compartment.
IMPRESSION: Early surgical changes of medial right knee unicompartmental
arthroplasty without complicating features.

## 2023-03-19 ENCOUNTER — Encounter: Payer: Self-pay | Admitting: Internal Medicine

## 2024-04-06 NOTE — Progress Notes (Signed)
 [facility] - Clinic Note  04/14/2024   CHIEF COMPLAINT Patient presents for Retina Evaluation  HISTORY OF PRESENT ILLNESS: Derek Lara is a 73 y.o. male who presents to the clinic today for:  HPI     Retina Evaluation   In both eyes.  This started 2 months ago.  Duration of 2 months.  I, the attending physician,  performed the HPI with the patient and updated documentation appropriately.        Comments   Retina eval per Dr Dawayne for DM exam pt is reporting no vision changes he denies flashes or floaters pt last reading 162 A1C 6.6 2 month       Last edited by Valdemar Rogue, MD on 04/18/2024  2:09 PM.     Pt not having any issues vision wise. Had cataract surgery in [place]. DM is well controlled.   Referring physician: Pia Kerney SQUIBB, MD [address] [place],  [place] [address]  HISTORICAL INFORMATION:  Selected notes from the MEDICAL RECORD NUMBER Referred by Dr. Pia (PCP) for DM exam LEE:  Ocular Hx- PMH-   CURRENT MEDICATIONS: No current outpatient medications on file. (Ophthalmic Drugs)   No current facility-administered medications for this visit. (Ophthalmic Drugs)   Current Outpatient Medications (Other)  Medication Sig   aspirin  EC 325 MG tablet Take 1 tablet (325 mg total) by mouth 2 (two) times daily.   atorvastatin  (LIPITOR) 20 MG tablet Take 20 mg by mouth daily after supper.   baclofen  (LIORESAL ) 10 MG tablet Take 1 tablet (10 mg total) by mouth 3 (three) times daily. As needed for muscle spasm   Cyanocobalamin (B-12) 2500 MCG TABS Take 2,500 mcg by mouth daily.   gabapentin  (NEURONTIN ) 600 MG tablet Take 600 mg by mouth 3 (three) times daily.    HYDROcodone -acetaminophen  (NORCO) 10-325 MG tablet Take 1 tablet by mouth every 6 (six) hours as needed.   lisinopril -hydrochlorothiazide  (PRINZIDE ,ZESTORETIC ) 20-25 MG tablet Take 1 tablet by mouth daily.   metFORMIN  (GLUCOPHAGE ) 500 MG tablet Take 500 mg by mouth 2 (two)  times daily.   metoprolol  tartrate (LOPRESSOR ) 50 MG tablet Take 50 mg by mouth 2 (two) times daily.   Multiple Vitamin (MULTIVITAMIN WITH MINERALS) TABS tablet Take 1 tablet by mouth daily.   ondansetron  (ZOFRAN ) 4 MG tablet Take 1 tablet (4 mg total) by mouth every 8 (eight) hours as needed for nausea or vomiting.   pyridOXINE (VITAMIN B-6) 100 MG tablet Take 100 mg by mouth daily.   sennosides-docusate sodium  (SENOKOT-S) 8.6-50 MG tablet Take 2 tablets by mouth daily.   No current facility-administered medications for this visit. (Other)   REVIEW OF SYSTEMS: ROS   Positive for: Endocrine, Cardiovascular, Eyes Last edited by Resa Delon ORN, COT on 04/14/2024  8:16 AM.     ALLERGIES No Known Allergies PAST MEDICAL HISTORY Past Medical History:  Diagnosis Date   Diabetes mellitus without complication (HCC)    TYPE 2    Hypertension    Primary localized osteoarthritis of left knee 02/10/2018   Past Surgical History:  Procedure Laterality Date   CATARACT EXTRACTION W/ INTRAOCULAR LENS  IMPLANT, BILATERAL     LEG SURGERY     vascular   PARTIAL KNEE ARTHROPLASTY Left 02/10/2018   Procedure: LEFT UNICOMPARTMENTAL KNEE;  Surgeon: Josefina Chew, MD;  Location: [facility] OR;  Service: Orthopedics;  Laterality: Left;   PARTIAL KNEE ARTHROPLASTY Right 05/09/2020   Procedure: UNICOMPARTMENTAL KNEE;  Surgeon: Josefina Chew, MD;  Location: [facility];  Service: Orthopedics;  Laterality: Right;   FAMILY HISTORY History reviewed. No pertinent family history. SOCIAL HISTORY Social History   Tobacco Use   Smoking status: Never   Smokeless tobacco: Never  Vaping Use   Vaping status: Never Used  Substance Use Topics   Alcohol use: Yes    Comment: occ   Drug use: Never       OPHTHALMIC EXAM:  Base Eye Exam     Visual Acuity (Snellen - Linear)       Right Left   Dist cc 20/25 -2 20/20   Dist ph cc 20/20     Correction: Glasses         Tonometry (Tonopen, 8:21 AM)        Right Left   Pressure 18 15         Pupils       Pupils Dark Light Shape React APD   Right PERRL 4 3 Round Brisk None   Left PERRL 4 3 Round Brisk None         Visual Fields       Left Right    Full Full         Extraocular Movement       Right Left    Full, Ortho Full, Ortho         Dilation     Both eyes: 2.5% Phenylephrine  @ 8:21 AM           Slit Lamp and Fundus Exam     External Exam       Right Left   External Normal Normal         Slit Lamp Exam       Right Left   Lids/Lashes Dermatochalasis Dermatochalasis   Conjunctiva/Sclera mild melanosis mild melanosis   Cornea Arcus, tear film debris, Well healed temporal cataract wound Arcus, tear film debris, Well healed temporal cataract wound   Anterior Chamber Deep and clear Deep and clear   Iris Round and dilated, no NVI Round and dilated, no NVI   Lens PC IOL in good position PC IOL in good position   Anterior Vitreous mild syneresis mild syneresis         Fundus Exam       Right Left   Disc mild pallor, +cupping, temporal PPA Pink and sharp, +cupping, temporal PPA   C/D Ratio 0.8 0.8   Macula Flat, Good foveal reflex, trace ERM, no heme or edema Flat, Good foveal reflex, trace ERM, no heme or edema   Vessels mild attenuation, milld AV crossing changes mild attenuation, mild tortuosity   Periphery Attached, No heme Attached, No heme           IMAGING AND PROCEDURES  Imaging and Procedures for 04/14/2024  OCT, Retina - OU - Both Eyes       Right Eye Quality was good. Central Foveal Thickness: 266. Progression has no prior data. Findings include normal foveal contour, no IRF, no SRF (Focal ERM w/ mild pucker nasal mac).   Left Eye Quality was good. Central Foveal Thickness: 276. Progression has no prior data. Findings include normal foveal contour, no IRF, no SRF (Focal ERM w/ mild pucker nasal mac).   Notes *Images captured and stored on drive  Diagnosis / Impression:   NFP, no IRF/ SRF OU No DME OU  Clinical management:  See below  Abbreviations: NFP - Normal foveal profile. CME - cystoid macular edema. PED - pigment epithelial detachment.  IRF - intraretinal fluid. SRF - subretinal fluid. EZ - ellipsoid zone. ERM - epiretinal membrane. ORA - outer retinal atrophy. ORT - outer retinal tubulation. SRHM - subretinal hyper-reflective material. IRHM - intraretinal hyper-reflective material           ASSESSMENT/PLAN:   ICD-10-CM   1. Diabetes mellitus type 2 without retinopathy (HCC)  E11.9 OCT, Retina - OU - Both Eyes    2. Diabetes mellitus treated with oral medication (HCC)  E11.9    Z79.84     3. Essential hypertension  I10     4. Hypertensive retinopathy of both eyes  H35.033 OCT, Retina - OU - Both Eyes    5. Pseudophakia of both eyes  Z96.1      1,2. Diabetes mellitus, type 2 without retinopathy  - 6.7 on 08.15.25  - Pt takes Metformin   - The incidence, risk factors for progression, natural history and treatment options for diabetic retinopathy  were discussed with patient.   - The need for close monitoring of blood glucose, blood pressure, and serum lipids, avoiding cigarette or any type of tobacco, and the need for long term follow up was also discussed with patient. - f/u in 1 year, sooner prn   3,4. Hypertensive retinopathy OU - discussed importance of tight BP control - monitor   5. Pseudophakia OU  - s/p CE/IOL in [place]  - IOLs in good position, doing well  - monitor   Ophthalmic Meds Ordered this visit:  No orders of the defined types were placed in this encounter.    Return in about 1 year (around 04/14/2025) for DM exam, DFE, OCT.  There are no Patient Instructions on file for this visit.  Explained the diagnoses, plan, and follow up with the patient and they expressed understanding.  Patient expressed understanding of the importance of proper follow up care.   This document serves as a record of services personally  performed by Redell JUDITHANN Hans, MD, PhD. It was created on their behalf by Almetta Pesa, an ophthalmic technician. The creation of this record is the provider's dictation and/or activities during the visit.    Electronically signed by: Almetta Pesa, OA, 04/18/24  2:10 PM  Redell JUDITHANN Hans, M.D., Ph.D. Diseases & Surgery of the Retina and Vitreous [facility] 04/14/2024  I have reviewed the above documentation for accuracy and completeness, and I agree with the above. Redell JUDITHANN Hans, M.D., Ph.D. 04/18/24 2:12 PM   Abbreviations: M myopia (nearsighted); A astigmatism; H hyperopia (farsighted); P presbyopia; Mrx spectacle prescription;  CTL contact lenses; OD right eye; OS left eye; OU both eyes  XT exotropia; ET esotropia; PEK punctate epithelial keratitis; PEE punctate epithelial erosions; DES dry eye syndrome; MGD meibomian gland dysfunction; ATs artificial tears; PFAT's preservative free artificial tears; NSC nuclear sclerotic cataract; PSC posterior subcapsular cataract; ERM epi-retinal membrane; PVD posterior vitreous detachment; RD retinal detachment; DM diabetes mellitus; DR diabetic retinopathy; NPDR non-proliferative diabetic retinopathy; PDR proliferative diabetic retinopathy; CSME clinically significant macular edema; DME diabetic macular edema; dbh dot blot hemorrhages; CWS cotton wool spot; POAG primary open angle glaucoma; C/D cup-to-disc ratio; HVF humphrey visual field; GVF goldmann visual field; OCT optical coherence tomography; IOP intraocular pressure; BRVO Branch retinal vein occlusion; CRVO central retinal vein occlusion; CRAO central retinal artery occlusion; BRAO branch retinal artery occlusion; RT retinal tear; SB scleral buckle; PPV pars plana vitrectomy; VH Vitreous hemorrhage; PRP panretinal laser photocoagulation; IVK intravitreal kenalog; VMT vitreomacular traction; MH Macular hole;  NVD neovascularization of the disc; NVE neovascularization  elsewhere; AREDS age related eye disease study; ARMD age related macular degeneration; POAG primary open angle glaucoma; EBMD epithelial/anterior basement membrane dystrophy; ACIOL anterior chamber intraocular lens; IOL intraocular lens; PCIOL posterior chamber intraocular lens; Phaco/IOL phacoemulsification with intraocular lens placement; PRK photorefractive keratectomy; LASIK laser assisted in situ keratomileusis; HTN hypertension; DM diabetes mellitus; COPD chronic obstructive pulmonary disease

## 2024-04-12 ENCOUNTER — Encounter (INDEPENDENT_AMBULATORY_CARE_PROVIDER_SITE_OTHER): Admitting: Ophthalmology

## 2024-04-14 ENCOUNTER — Ambulatory Visit (INDEPENDENT_AMBULATORY_CARE_PROVIDER_SITE_OTHER): Admitting: Ophthalmology

## 2024-04-14 ENCOUNTER — Encounter (INDEPENDENT_AMBULATORY_CARE_PROVIDER_SITE_OTHER): Payer: Self-pay | Admitting: Ophthalmology

## 2024-04-14 DIAGNOSIS — Z7984 Long term (current) use of oral hypoglycemic drugs: Secondary | ICD-10-CM

## 2024-04-14 DIAGNOSIS — H3581 Retinal edema: Secondary | ICD-10-CM

## 2024-04-14 DIAGNOSIS — Z961 Presence of intraocular lens: Secondary | ICD-10-CM

## 2024-04-14 DIAGNOSIS — I10 Essential (primary) hypertension: Secondary | ICD-10-CM | POA: Diagnosis not present

## 2024-04-14 DIAGNOSIS — E119 Type 2 diabetes mellitus without complications: Secondary | ICD-10-CM | POA: Diagnosis not present

## 2024-04-14 DIAGNOSIS — H35033 Hypertensive retinopathy, bilateral: Secondary | ICD-10-CM

## 2024-04-18 ENCOUNTER — Encounter (INDEPENDENT_AMBULATORY_CARE_PROVIDER_SITE_OTHER): Payer: Self-pay | Admitting: Ophthalmology

## 2025-04-14 ENCOUNTER — Encounter (INDEPENDENT_AMBULATORY_CARE_PROVIDER_SITE_OTHER): Admitting: Ophthalmology
